# Patient Record
Sex: Male | Born: 1985 | Race: Black or African American | Hispanic: No | Smoking: Current every day smoker
Health system: Southern US, Community
[De-identification: ages and names within clinical notes are randomized; demographics above are authoritative.]

## PROBLEM LIST (undated history)

## (undated) DIAGNOSIS — G35 Multiple sclerosis: Secondary | ICD-10-CM

## (undated) HISTORY — PX: KNEE SURGERY: SHX244

---

## 2011-09-17 ENCOUNTER — Encounter (HOSPITAL_BASED_OUTPATIENT_CLINIC_OR_DEPARTMENT_OTHER): Payer: Self-pay | Admitting: *Deleted

## 2011-09-17 ENCOUNTER — Emergency Department (HOSPITAL_BASED_OUTPATIENT_CLINIC_OR_DEPARTMENT_OTHER)
Admission: EM | Admit: 2011-09-17 | Discharge: 2011-09-17 | Disposition: A | Discharge status: Home / Self Care | Payer: Self-pay | Attending: Emergency Medicine | Admitting: Emergency Medicine

## 2011-09-17 ENCOUNTER — Emergency Department (HOSPITAL_BASED_OUTPATIENT_CLINIC_OR_DEPARTMENT_OTHER): Payer: Self-pay

## 2011-09-17 DIAGNOSIS — R0789 Other chest pain: Secondary | ICD-10-CM | POA: Insufficient documentation

## 2011-09-17 DIAGNOSIS — R0602 Shortness of breath: Secondary | ICD-10-CM | POA: Insufficient documentation

## 2011-09-17 DIAGNOSIS — F419 Anxiety disorder, unspecified: Secondary | ICD-10-CM

## 2011-09-17 DIAGNOSIS — F411 Generalized anxiety disorder: Secondary | ICD-10-CM | POA: Insufficient documentation

## 2011-09-17 MED ORDER — ALBUTEROL SULFATE HFA 108 (90 BASE) MCG/ACT IN AERS
2.0000 | INHALATION_SPRAY | RESPIRATORY_TRACT | Status: DC | PRN
  Filled 2011-09-17: qty 6.7

## 2011-09-17 NOTE — ED Notes (Addendum)
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 Married with 4 children. "Feels like I'm going all the time." Heart feels like it's racing and he feels anxious. Also c/o chest discomfort. "Feels weird"

## 2011-09-17 NOTE — ED Provider Notes (Signed)
History     CSN: [id number]  Arrival date & time 09/17/11  0026   First MD Initiated Contact with Patient 09/17/11 0100      Chief Complaint  Patient presents with  . Anxiety    (Consider location/radiation/quality/duration/timing/severity/associated sxs/prior treatment) HPI Is a 26 year old male with a one-day history of vague sensation in his chest. He initially described it as anxiety as he is working 60 hour week now and feels overwhelmed at home and at work. He states he feels like his heart is pounding and that he is short of breath. He denies frank pain. He denies nausea or vomiting. He denies diaphoresis. He denies a history of asthma or family history of asthma. He denies cough. Symptoms are moderate.  History reviewed. No pertinent past medical history.  Past Surgical History  Procedure Date  . Knee surgery     History reviewed. No pertinent family history.  History  Substance Use Topics  . Smoking status: Current Everyday Smoker  . Smokeless tobacco: Not on file  . Alcohol Use: No      Review of Systems  All other systems reviewed and are negative.    Allergies  Shellfish allergy and Penicillins  Home Medications   Current Outpatient Rx  Name Route Sig Dispense Refill  . IRON-B12-VITAMINS PO Oral Take 1 tablet by mouth.      BP 115/74  Pulse 77  Temp 98.1 F (36.7 C) (Oral)  Resp 20  Ht 6\' 2"  (1.88 m)  Wt 170 lb (77.111 kg)  BMI 21.83 kg/m2  SpO2 100%  Physical Exam General: Well-developed, well-nourished male in no acute distress; appearance consistent with age of record HENT: normocephalic, atraumatic Eyes: pupils equal round and reactive to light; extraocular muscles intact Neck: supple Heart: regular rate and rhythm Lungs: Decreased air movement bilaterally without frank wheezing Abdomen: soft; nondistended; nontender Extremities: No deformity; full range of motion Neurologic: Awake, alert and oriented; motor function intact in all  extremities and symmetric; no facial droop Skin: Warm and dry Psychiatric: Anxious    ED Course  Procedures (including critical care time)     MDM  Nursing notes and vitals signs, including pulse oximetry, reviewed.  Summary of this visit's results, reviewed by myself:  Labs:  No results found for this or any previous visit.  Imaging Studies: Dg Chest 2 View  09/17/2011  *RADIOLOGY REPORT*  Clinical Data: 26 year old male with shortness of breath and chest discomfort.  CHEST - 2 VIEW  Comparison: None.  Findings: Somewhat shallow lung volumes on the PA view, more normal on the lateral. Normal cardiac size and mediastinal contours.  The lungs are clear.  No pneumothorax or effusion. Visualized tracheal air column is within normal limits.  Ventricular size and configuration are within normal limits.  IMPRESSION: Negative, no acute cardiopulmonary abnormality.  Original Report Authenticated By: Ulla Potash III, M.D.    1:44 AM No significant change with albuterol treatment but suspect patient is having some bronchospasm due to decreased air movement. There is no evidence of acute disease on chest x-ray her EKG in patient is young with out significant risk factors.     Date: 09/17/2011 12:46 AM  Rate: 71  Rhythm: normal sinus rhythm  QRS Axis: normal  Intervals: normal  ST/T Wave abnormalities: normal  Conduction Disutrbances: none  Narrative Interpretation: unremarkable  Comparison with previous EKG: none available           Hanley Seamen, MD 09/17/11 0144

## 2011-09-17 NOTE — Discharge Instructions (Signed)

## 2011-09-17 NOTE — Patient Instructions (Signed)
Insructed pt on the proper use of administering albuteral mdi via aerochamber. Pt tolerated well

## 2018-02-10 ENCOUNTER — Inpatient Hospital Stay (HOSPITAL_COMMUNITY)
Admission: EM | Admit: 2018-02-10 | Discharge: 2018-02-14 | DRG: 059 | Disposition: A | Discharge status: Home Health | Payer: Medicaid Other | Attending: Internal Medicine | Admitting: Internal Medicine

## 2018-02-10 ENCOUNTER — Emergency Department (HOSPITAL_COMMUNITY): Payer: Medicaid Other

## 2018-02-10 ENCOUNTER — Other Ambulatory Visit: Payer: Self-pay

## 2018-02-10 ENCOUNTER — Encounter (HOSPITAL_COMMUNITY): Payer: Self-pay | Admitting: Oncology

## 2018-02-10 DIAGNOSIS — Z91013 Allergy to seafood: Secondary | ICD-10-CM

## 2018-02-10 DIAGNOSIS — R29898 Other symptoms and signs involving the musculoskeletal system: Secondary | ICD-10-CM | POA: Diagnosis not present

## 2018-02-10 DIAGNOSIS — E871 Hypo-osmolality and hyponatremia: Secondary | ICD-10-CM | POA: Diagnosis not present

## 2018-02-10 DIAGNOSIS — X58XXXA Exposure to other specified factors, initial encounter: Secondary | ICD-10-CM | POA: Diagnosis present

## 2018-02-10 DIAGNOSIS — Y9289 Other specified places as the place of occurrence of the external cause: Secondary | ICD-10-CM

## 2018-02-10 DIAGNOSIS — Z79899 Other long term (current) drug therapy: Secondary | ICD-10-CM

## 2018-02-10 DIAGNOSIS — T380X5A Adverse effect of glucocorticoids and synthetic analogues, initial encounter: Secondary | ICD-10-CM | POA: Diagnosis not present

## 2018-02-10 DIAGNOSIS — D72829 Elevated white blood cell count, unspecified: Secondary | ICD-10-CM | POA: Diagnosis not present

## 2018-02-10 DIAGNOSIS — F17210 Nicotine dependence, cigarettes, uncomplicated: Secondary | ICD-10-CM | POA: Diagnosis present

## 2018-02-10 DIAGNOSIS — G35 Multiple sclerosis: Principal | ICD-10-CM | POA: Diagnosis present

## 2018-02-10 DIAGNOSIS — R1310 Dysphagia, unspecified: Secondary | ICD-10-CM | POA: Diagnosis present

## 2018-02-10 HISTORY — DX: Multiple sclerosis: G35

## 2018-02-10 LAB — CBC WITH DIFFERENTIAL/PLATELET
Abs Immature Granulocytes: 0.02 10*3/uL (ref 0.00–0.07)
BASOS ABS: 0.1 10*3/uL (ref 0.0–0.1)
BLASTS: 0 %
Band Neutrophils: 0 %
Basophils Relative: 1 %
Eosinophils Absolute: 0.2 10*3/uL (ref 0.0–0.5)
Eosinophils Relative: 2 %
HCT: 41.6 % (ref 39.0–52.0)
HEMOGLOBIN: 13.1 g/dL (ref 13.0–17.0)
Immature Granulocytes: 0 %
LYMPHS ABS: 1.9 10*3/uL (ref 0.7–4.0)
Lymphocytes Relative: 17 %
MCH: 30 pg (ref 26.0–34.0)
MCHC: 31.5 g/dL (ref 30.0–36.0)
MCV: 95.4 fL (ref 80.0–100.0)
METAMYELOCYTES PCT: 0 %
MONOS PCT: 8 %
Monocytes Absolute: 0.9 10*3/uL (ref 0.1–1.0)
Myelocytes: 0 %
NEUTROS ABS: 8 10*3/uL — AB (ref 1.7–7.7)
NEUTROS PCT: 72 %
NRBC: 0 % (ref 0.0–0.2)
NRBC: 0 /100{WBCs}
Other: 0 %
PLATELETS: 172 10*3/uL (ref 150–400)
Promyelocytes Relative: 0 %
RBC: 4.36 MIL/uL (ref 4.22–5.81)
RDW: 13.3 % (ref 11.5–15.5)
WBC: 11 10*3/uL — AB (ref 4.0–10.5)

## 2018-02-10 LAB — BASIC METABOLIC PANEL
Anion gap: 4 — ABNORMAL LOW (ref 5–15)
BUN: 15 mg/dL (ref 6–20)
CO2: 22 mmol/L (ref 22–32)
CREATININE: 0.93 mg/dL (ref 0.61–1.24)
Calcium: 8.6 mg/dL — ABNORMAL LOW (ref 8.9–10.3)
Chloride: 105 mmol/L (ref 98–111)
Glucose, Bld: 100 mg/dL — ABNORMAL HIGH (ref 70–99)
Potassium: 4.2 mmol/L (ref 3.5–5.1)
SODIUM: 131 mmol/L — AB (ref 135–145)

## 2018-02-10 LAB — ETHANOL

## 2018-02-10 MED ORDER — PREGABALIN 75 MG PO CAPS
75.0000 mg | ORAL_CAPSULE | Freq: Two times a day (BID) | ORAL | Status: DC
  Administered 2018-02-10 – 2018-02-14 (×9): 75 mg via ORAL
  Filled 2018-02-10 (×9): qty 1

## 2018-02-10 MED ORDER — GADOBUTROL 1 MMOL/ML IV SOLN
7.0000 mL | Freq: Once | INTRAVENOUS | Status: AC | PRN
  Administered 2018-02-10: 7 mL via INTRAVENOUS

## 2018-02-10 MED ORDER — NICOTINE 14 MG/24HR TD PT24
14.0000 mg | MEDICATED_PATCH | Freq: Every day | TRANSDERMAL | Status: DC
  Administered 2018-02-10 – 2018-02-14 (×5): 14 mg via TRANSDERMAL
  Filled 2018-02-10 (×5): qty 1

## 2018-02-10 MED ORDER — ENOXAPARIN SODIUM 40 MG/0.4ML ~~LOC~~ SOLN
40.0000 mg | SUBCUTANEOUS | Status: DC
  Administered 2018-02-10 – 2018-02-13 (×4): 40 mg via SUBCUTANEOUS
  Filled 2018-02-10 (×4): qty 0.4

## 2018-02-10 MED ORDER — ACETAMINOPHEN 650 MG RE SUPP: 650.0000 mg | Freq: Four times a day (QID) | RECTAL | Status: DC | PRN

## 2018-02-10 MED ORDER — OXYCODONE-ACETAMINOPHEN 5-325 MG PO TABS
1.0000 | ORAL_TABLET | Freq: Once | ORAL | Status: AC
  Administered 2018-02-10: 1 via ORAL
  Filled 2018-02-10: qty 1

## 2018-02-10 MED ORDER — ONDANSETRON HCL 4 MG PO TABS: 4.0000 mg | ORAL_TABLET | Freq: Four times a day (QID) | ORAL | Status: DC | PRN

## 2018-02-10 MED ORDER — ACETAMINOPHEN 325 MG PO TABS
650.0000 mg | ORAL_TABLET | Freq: Four times a day (QID) | ORAL | Status: DC | PRN
  Administered 2018-02-10 – 2018-02-13 (×5): 650 mg via ORAL
  Filled 2018-02-10 (×5): qty 2

## 2018-02-10 MED ORDER — SODIUM CHLORIDE 0.9 % IV SOLN
1000.0000 mg | Freq: Every day | INTRAVENOUS | Status: AC
  Administered 2018-02-10 – 2018-02-14 (×5): 1000 mg via INTRAVENOUS
  Filled 2018-02-10 (×6): qty 8

## 2018-02-10 MED ORDER — ONDANSETRON HCL 4 MG/2ML IJ SOLN: 4.0000 mg | Freq: Four times a day (QID) | INTRAMUSCULAR | Status: DC | PRN

## 2018-02-10 NOTE — ED Notes (Signed)
Patient transported to X-ray 

## 2018-02-10 NOTE — ED Provider Notes (Signed)
[facility] EMERGENCY DEPARTMENT Provider Note   CSN: [id number] Arrival date & time: 02/10/18  0046     History   Chief Complaint Chief Complaint  Patient presents with  . Knee Pain    HPI Bob Solomon is a 32 y.o. male.  The history is provided by the patient and medical records.  Knee Pain       32 y.o. M with hx of MS, presenting to the ED for left knee pain.  States he was walking today after work and felt his left leg "buckle" out from under him causing him to fall on his left knee.  States left knee is now hurting, more so along lateral aspect.  He reports he is having some difficulty walking but does not feel it is due to pain.  He states "I feel like my brain is not getting the signal to my leg".  States he feels uncoordinated.  He denies any focal numbness or weakness of his left leg.  He has not had any low back pain, bowel or bladder incontinence.  He denies any fevers or chills.  No chest pain or shortness of breath.  He does report some intermittent blurred vision which is not necessarily abnormal for him.  States he feels like he may be getting a "cold".  Reports usually when he gets these types of symptoms he develops an MS flare.  States his last MS flare was about 5 months ago, states he was admitted to [facility] at that time and had steroid infusions and MRIs.  He was admitted for a total of about 4 days.  States he is follows with neurologist at [facility], Dr. Gaynelle Adu.  Past Medical History:  Diagnosis Date  . MS (multiple sclerosis) (HCC)     There are no active problems to display for this patient.   Past Surgical History:  Procedure Laterality Date  . KNEE SURGERY          Home Medications    Prior to Admission medications   Medication Sig Start Date End Date Taking? Authorizing Provider  IRON-B12-VITAMINS PO Take 1 tablet by mouth.    [provider]    Family History No family history on file.  Social  History Social History   Tobacco Use  . Smoking status: Current Every Day Smoker    Packs/day: 0.50    Years: 15.00    Pack years: 7.50    Types: Cigarettes  . Smokeless tobacco: Never Used  Substance Use Topics  . Alcohol use: Yes    Comment: Social  . Drug use: No     Allergies   Shellfish allergy and Penicillins   Review of Systems Review of Systems  Musculoskeletal: Positive for arthralgias.  All other systems reviewed and are negative.    Physical Exam Updated Vital Signs BP 128/89   Pulse (!) 109   Temp 98.6 F (37 C) (Oral)   Resp 16   Ht 6\' 2"  (1.88 m)   SpO2 98%   BMI 21.83 kg/m   Physical Exam  Constitutional: He is oriented to person, place, and time. He appears well-developed and well-nourished.  Smells strongly of marijuana  HENT:  Head: Normocephalic and atraumatic.  Mouth/Throat: Oropharynx is clear and moist.  Eyes: Pupils are equal, round, and reactive to light. Conjunctivae and EOM are normal.  EOMs fully intact, no nystagmus  Neck: Normal range of motion.  Cardiovascular: Normal rate, regular rhythm and normal heart sounds.  Pulmonary/Chest: Effort normal and breath sounds normal. No stridor. No respiratory distress.  Abdominal: Soft. Bowel sounds are normal. There is no tenderness. There is no rebound.  Musculoskeletal: Normal range of motion.  Reports pain around lateral aspect of left knee; no swelling or deformity; held in full extension at rest, when instructed to flex knee has apparent difficulty getting leg to move, I am able to passively flex the knee fully with no endorsed pain; leg is NVI No issues moving upper extremities  Neurological: He is alert and oriented to person, place, and time.  AAOx3, answering questions and following commands appropriately; difficulty moving left leg as described in MSK, otherwise normal ROM of all other extremities; no pronator drift, speech clear and goal oriented, no facial droop  Skin: Skin is  warm and dry.  Psychiatric: He has a normal mood and affect.  Nursing note and vitals reviewed.    ED Treatments / Results  Labs (all labs ordered are listed, but only abnormal results are displayed) Labs Reviewed  CBC WITH DIFFERENTIAL/PLATELET - Abnormal; Notable for the following components:      Result Value   WBC 11.0 (*)    Neutro Abs 8.0 (*)    All other components within normal limits  BASIC METABOLIC PANEL - Abnormal; Notable for the following components:   Sodium 131 (*)    Glucose, Bld 100 (*)    Calcium 8.6 (*)    Anion gap 4 (*)    All other components within normal limits  ETHANOL  RAPID URINE DRUG SCREEN, HOSP PERFORMED    EKG None  Radiology Dg Knee Complete 4 Views Left  Result Date: 02/10/2018 CLINICAL DATA:  Pain.  Buckled knee. EXAM: LEFT KNEE - COMPLETE 4+ VIEW COMPARISON:  None. FINDINGS: No evidence of fracture, dislocation, or joint effusion. No evidence of arthropathy or other focal bone abnormality. Soft tissues are unremarkable. IMPRESSION: Negative. Electronically Signed   By: Gerome Sam III M.D   On: 02/10/2018 01:50    Procedures Procedures (including critical care time)  Medications Ordered in ED Medications - No data to display   Initial Impression / Assessment and Plan / ED Course  I have reviewed the triage vital signs and the nursing notes.  Pertinent labs & imaging results that were available during my care of the patient were reviewed by me and considered in my medical decision making (see chart for details).  32 year old male here with left knee pain.  After talking with him, it seems he is having some issues with his left leg.  States he was walking and it "gave out on him".  Does have history of MS and is concerned that he may be having a flare.  States now it feels like his left leg is "uncoordinated" and his brain is "not getting signals to his legs".  Does report some intermittent blurred vision over the past few weeks but  states that is not necessarily uncommon for him.   She denies any headache, dizziness, confusion, focal numbness, or weakness.  No low back pain.  No bowel or bladder incontinence.  Patient reports he was hospitalized at [facility] about 5 months ago for MS flare and received steroid infusions for about 4 days.  I do not see any record of this.  States he had MRIs earlier this year, however I cannot find evidence of this either.  His last MRI in our system is from 2016, last OP neurology follow-up from august 2019 for infusions.  Given his history and new symptoms, will discuss with neurology for recommendations.  1:45 AM  Discussed with Dr. Amada Jupiter-- given isolated leg symptoms, this could localize anywhere.  Recommends MRI w/ and w/out contrast of brain, cervical, and thoracic spine but not lumbar.  If no acute findings, can follow-up with his OP neurologist at [facility].  If new findings, will need to consult neurology again.  Patient has been resting here for the past few hours.  At times, left leg observed to be bent drawn up towards the body and against bed rails so seems to be moving leg better than previously.  Will continue to monitor while awaiting MRI.  6:21 AM Patient in MRI at this time.  If no acute findings, feel he is stable to follow-up OP with his neurologist at [facility].  Care signed out to PA Harris at shift change-- she is aware of plan and will follow-up on MRI's.  Final Clinical Impressions(s) / ED Diagnoses   Final diagnoses:  Left leg weakness    ED Discharge Orders    None       Garlon Hatchet, PA-C 02/10/18 1610    Glynn Octave, MD 02/10/18 [phone]

## 2018-02-10 NOTE — Consult Note (Signed)
NEURO HOSPITALIST CONSULT NOTE   Requestig physician: Dr. Ophelia Charter  Reason for Consult: Left lower extremity weakness  History obtained from:  Patient     HPI:                                                                                                                                          Bob Solomon is an 32 y.o. male presenting with a several week history of dragging his left leg. He is unable to further specify when the symptoms started, but states that he delayed being seen by a physician due to his work. He states that when his LLE weakness worsened yesterday, he decided to go to the ED. He states that his MS was diagnosed several years ago but does not specify further. He states that he is on [employer] and sees a Insurance account manager, Dr. Fransico Michael at [place]. His last exacerbation was 4-5 months ago. Due to difficulty making appointments, the patient states that he may be switched to Tysabri infusions every 6 months. The patient is a somewhat reluctant/poor historian. 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 Has had some difficulty with talking and thinking since about 6 months ago.   MRI brain in the ED showed multiple chronic lesions exhibiting a distribution and morphologies most consistent with MS; 3 faint enhancing lesions were seen.   MRI of C-spine showed a possible demyelinating plaque in the cord at C5, with no enhancement to suggest active demyelination.  MRI of T-spine showed a small cord lesion at T7 consistent with the history of multiple sclerosis, with no enhancement to suggest active demyelination.   Past Medical History:  Diagnosis Date  . MS (multiple sclerosis) (HCC)     Past Surgical History:  Procedure Laterality Date  . KNEE SURGERY      Family History  Family history unknown: Yes              Social History:  reports that he has been smoking cigarettes. He has a 7.50 pack-year smoking history. He has never used smokeless  tobacco. He reports that he drinks alcohol. He reports that he has current or past drug history. Drug: Marijuana.  Allergies  Allergen Reactions  . Shellfish Allergy Anaphylaxis  . Penicillins Rash    MEDICATIONS:  Adderall Pregabalin Tysabri qmonth   ROS:                                                                                                                                       No headache. Has chronic binocular double vision.  No difficulty with chewing or swallowing. No neck pain. No CP, N/V or fever. Occasional cough.   Blood pressure 112/70, pulse 85, temperature 98.6 F (37 C), temperature source Oral, resp. rate 16, height 6\' 2"  (1.88 m), SpO2 97 %.   General Examination:                                                                                                       Physical Exam  HEENT-  [place]/AT   Lungs- Respirations unlabored Abdomen- nondistended Extremities- Warm and well perfused without edema  Neurological Examination Mental Status: Intact to complex questions and commands. No aphasia.  Cranial Nerves: [id number] visual fields all 4 quadrants of each eye. Tracks and fixates normally. PERRL without RAPD.   III,IV, VI: Left ptosis. EOMI.  V,VII: Smile symmetric, facial temp sensation decreased on the right VIII: hearing intact to voice IX,X: Palate rises symmetrically XI: Symmetric shoulder shrug XII: midline tongue extension Motor: Spastic tone in all 4 extremities, mild but worse to BLE relative to upper extremities. LUE 4/5 proximal and distal RUE 4+/5 proximal and distal LLE: 2/5 HF and knee extension; 1/5 ADF and APF RLE: 5/5 Sensory: Decreased temp sensation to LUE. Extinction LUE Decreased temp and FT LLE Deep Tendon Reflexes:  Biceps and brachioradialis: 1+ bilaterally Patellae: 2+ right, 3+ left Achilles: 3+ right, 1+  left Plantars: Right: downgoing   Left: mute Cerebellar: Mild dysmetria and dyssinergia with FNF bilaterally, worse on the left Gait: Deferred   Lab Results: Basic Metabolic Panel: Recent Labs  Lab 02/10/18 0205  NA 131*  K 4.2  CL 105  CO2 22  GLUCOSE 100*  BUN 15  CREATININE 0.93  CALCIUM 8.6*    CBC: Recent Labs  Lab 02/10/18 0205  WBC 11.0*  NEUTROABS 8.0*  HGB 13.1  HCT 41.6  MCV 95.4  PLT 172    Cardiac Enzymes: No results for input(s): CKTOTAL, CKMB, CKMBINDEX, TROPONINI in the last 168 hours.  Lipid Panel: No results for input(s): CHOL, TRIG, HDL, CHOLHDL, VLDL, LDLCALC in the last 168 hours.  Imaging: Mr Laqueta Jean And Wo Contrast  Result Date: 02/10/2018 CLINICAL DATA:  Left leg weakness and fall. History of multiple  sclerosis. EXAM: MRI HEAD WITHOUT AND WITH CONTRAST TECHNIQUE: Multiplanar, multiecho pulse sequences of the brain and surrounding structures were obtained without and with intravenous contrast. CONTRAST:  7 mL Gadavist COMPARISON:  01/18/2015 brain MRI report from [facility] FINDINGS: Brain: There is no evidence of acute infarct, intracranial hemorrhage, mass, midline shift, or extra-axial fluid collection. There is moderate cerebral atrophy. Innumerable T2 hyperintense lesions are present throughout the brain with involvement of the periventricular, deep, and juxtacortical cerebral white matter bilaterally. There is extensive periventricular involvement with multiple lesions oriented perpendicularly to the lateral ventricles. The corpus callosum is involved by multiple lesions and is diffusely thinned. Lesions are present in the cerebellum and right subthalamic region. There are numerous black holes on T1 weighted imaging. Partially ring-enhancing lesions measure 4 mm in the deep right frontal white matter (series 21, image 124) and 4 mm in the periventricular white matter lateral to the atrium of the right lateral ventricle  (series 21, image 111). There is also a 5 mm enhancing juxtacortical lesion in the right parietal lobe (series 22, image 11 and series 23, image 6). Vascular: Major intracranial vascular flow voids are preserved. Skull and upper cervical spine: Unremarkable bone marrow signal. Sinuses/Orbits: Unremarkable orbits. Minimal scattered paranasal sinus mucosal thickening. Clear mastoid air cells. Other: None. IMPRESSION: Extensive changes of multiple sclerosis with 3 subcentimeter enhancing lesions in the right cerebral hemisphere compatible with active demyelination. Electronically Signed   By: Sebastian Ache M.D.   On: 02/10/2018 07:27   Mr Cervical Spine W Wo Contrast  Result Date: 02/10/2018 CLINICAL DATA:  Left leg weakness and fall. History of multiple sclerosis. EXAM: MRI CERVICAL SPINE WITHOUT AND WITH CONTRAST TECHNIQUE: Multiplanar and multiecho pulse sequences of the cervical spine, to include the craniocervical junction and cervicothoracic junction, were obtained without and with intravenous contrast. CONTRAST:  7 mL Gadavist COMPARISON:  None. FINDINGS: Axial sequences are up to moderately motion degraded. Alignment: Cervical spine straightening.  No listhesis. Vertebrae: No fracture, suspicious osseous lesion, or significant marrow edema. Cord: Cord assessment is limited by motion artifact on axial sequences. There is the suggestion of a faintly hyperintense lesion in the left lateral cord at C5 on sagittal T2 and STIR sequences which is not confirmed axially. There is no significant cord expansion or volume loss. There is no abnormal cord enhancement. Posterior Fossa, vertebral arteries, paraspinal tissues: Preserved vertebral artery flow voids. Unremarkable paraspinal soft tissues. Posterior fossa evaluated on separate brain MRI. Disc levels: Preserved disc space heights without disc herniation or stenosis in the cervical spine. Small T1-2 disc protrusion as reported on separate thoracic spine MRI.  IMPRESSION: Motion degraded examination with possible demyelinating plaque in the cord at C5. No enhancement to suggest active demyelination. Electronically Signed   By: Sebastian Ache M.D.   On: 02/10/2018 07:40   Mr Thoracic Spine W Wo Contrast  Result Date: 02/10/2018 CLINICAL DATA:  Left leg weakness and fall. History of multiple sclerosis. EXAM: MRI THORACIC WITHOUT AND WITH CONTRAST TECHNIQUE: Multiplanar and multiecho pulse sequences of the thoracic spine were obtained without and with intravenous contrast. CONTRAST:  7 mL Gadavist COMPARISON:  None. FINDINGS: Alignment:  Normal. Vertebrae: No fracture, suspicious osseous lesion, or significant marrow edema. Cord: Nonenhancing T2 hyperintense lesion in the right cord at T7. No significant cord expansion or volume loss. Paraspinal and other soft tissues: Unremarkable. Disc levels: Small left paracentral disc protrusion at T1-2 without stenosis or spinal cord mass effect. IMPRESSION: 1. Small cord  lesion at T7 consistent with the history of multiple sclerosis. No enhancement to suggest active demyelination. 2. Small T1-2 disc protrusion without stenosis. Electronically Signed   By: Sebastian Ache M.D.   On: 02/10/2018 07:34   Dg Knee Complete 4 Views Left  Result Date: 02/10/2018 CLINICAL DATA:  Pain.  Buckled knee. EXAM: LEFT KNEE - COMPLETE 4+ VIEW COMPARISON:  None. FINDINGS: No evidence of fracture, dislocation, or joint effusion. No evidence of arthropathy or other focal bone abnormality. Soft tissues are unremarkable. IMPRESSION: Negative. Electronically Signed   By: Gerome Sam III M.D   On: 02/10/2018 01:50    Assessment: 32 year old male with MS exacerbation 1. MRI brain reveals 3 faint enhancing lesions in the right cerebral hemisphere on a background of multiple chronic lesions that exhibit a distribution and morphologies that are typical of MS.  2. Exam findings with multiple deficits in a patchy distribution, consistent with the  chronic brain MRI findings.  3. On Armando Reichert as an outpatient. Sees Dr. Fransico Michael at [facility].   4. Mild hyponatremia  Recommendations: 1. IV Solumedrol 1000 mg qd x 5 days 2. Monitor CBG and electrolytes 3. Correct mild hyponatremia 4. PT/OT 5. Outpatient follow up with his Neurologist, Dr. Fransico Michael, after discharge.    Electronically signed: Dr. Caryl Pina 02/10/2018, 11:24 AM

## 2018-02-10 NOTE — H&P (Signed)
History and Physical    Male Bob Solomon ZOX:[id number] DOB: 08/31/1985 DOA: 02/10/2018  PCP:  [facility] Consultants:  [facility] - neurology Patient coming from:  Home - lives with [place]; Jackey LogeSteffanie Rainwater [phone]  Chief Complaint: Knee issue  HPI: Bob Solomon is a 32 y.o. male with medical history significant of MS presenting with fall related to his knee giving out.   He fell at work - his knee buckled out of nowhere.  It happened like that before and was the beginning of an MS exacerbation.  He was hospitalized 5 days that time getting pumped full of steroids.  He was diagnosed with MS 8 years ago, has had flares 11-12 times.  He has been hospitalized for flares 6-7 times.  He has been having diplopia for about 1 month.  He finds himself dragging his left leg a lot.  He is having some trouble swallowing now, thinks he is getting sick with a sore throat.   ED Course:  H/o MS with active lesions.  Not on medication.  Neuro is consulting.  IV solumedrol given.  Review of Systems: As per HPI; otherwise review of systems reviewed and negative.   Ambulatory Status: Ambulates without assistance - supposed to use a cane but he doesn't  Past Medical History:  Diagnosis Date  . MS (multiple sclerosis) (HCC)     Past Surgical History:  Procedure Laterality Date  . KNEE SURGERY      Social History   Socioeconomic History  . Marital status: Married    Spouse name: Not on file  . Number of children: Not on file  . Years of education: Not on file  . Highest education level: Not on file  Occupational History  . Occupation: Financial risk analyst  Social Needs  . Financial resource strain: Not on file  . Food insecurity:    Worry: Not on file    Inability: Not on file  . Transportation needs:    Medical: Not on file    Non-medical: Not on file  Tobacco Use  . Smoking status: Current Every Day Smoker    Packs/day: 0.50    Years: 15.00    Pack years: 7.50    Types: Cigarettes  .  Smokeless tobacco: Never Used  Substance and Sexual Activity  . Alcohol use: Yes    Comment: Social  . Drug use: Yes    Types: Marijuana  . Sexual activity: Yes    Birth control/protection: Condom  Lifestyle  . Physical activity:    Days per week: Not on file    Minutes per session: Not on file  . Stress: Not on file  Relationships  . Social connections:    Talks on phone: Not on file    Gets together: Not on file    Attends religious service: Not on file    Active member of club or organization: Not on file    Attends meetings of clubs or organizations: Not on file    Relationship status: Not on file  . Intimate partner violence:    Fear of current or ex partner: Not on file    Emotionally abused: Not on file    Physically abused: Not on file    Forced sexual activity: Not on file  Other Topics Concern  . Not on file  Social History Narrative  . Not on file    Allergies  Allergen Reactions  . Shellfish Allergy Anaphylaxis  . Penicillins Rash    Family History  Family  history unknown: Yes    Prior to Admission medications   Medication Sig Start Date End Date Taking? Authorizing Provider  ADDERALL XR 30 MG 24 hr capsule Take 30 mg by mouth daily. 01/10/18  Yes [provider]  pregabalin (LYRICA) 75 MG capsule Take 75 mg by mouth 2 (two) times daily. 09/16/15  Yes [provider]    Physical Exam: Vitals:   02/10/18 0200 02/10/18 0215 02/10/18 1251 02/10/18 1252  BP: 120/66 112/70  (!) 131/99  Pulse: 87 85  67  Resp:      Temp:   97.8 F (36.6 C)   TempSrc:   Oral   SpO2: 96% 97%  100%  Height:         General:  Appears calm and comfortable and is NAD Eyes:  PERRL, EOMI, normal lids, iris ENT:  grossly normal hearing, lips & tongue, mmm; appropriate dentition Neck:  no LAD, masses or thyromegaly; no carotid bruits Cardiovascular:  RRR, no m/r/g. No LE edema.  Respiratory:   CTA bilaterally with no wheezes/rales/rhonchi.  Normal  respiratory effort. Abdomen:  soft, NT, ND, NABS Back:   normal alignment, no CVAT Skin:  no rash or induration seen on limited exam Musculoskeletal:  grossly normal tone BUE/BLE other than decreased strength LLE, good ROM, no bony abnormality Lower extremity:  No LE edema.  Limited foot exam with no ulcerations.  2+ distal pulses. Psychiatric:  grossly normal mood and affect, speech fluent and appropriate, AOx3 Neurologic:  CN 2-12 grossly intact, moves all extremities in coordinated fashion, sensation intact   Radiological Exams on Admission: Mr Laqueta Jean And Wo Contrast  Result Date: 02/10/2018 CLINICAL DATA:  Left leg weakness and fall. History of multiple sclerosis. EXAM: MRI HEAD WITHOUT AND WITH CONTRAST TECHNIQUE: Multiplanar, multiecho pulse sequences of the brain and surrounding structures were obtained without and with intravenous contrast. CONTRAST:  7 mL Gadavist COMPARISON:  01/18/2015 brain MRI report from [facility] FINDINGS: Brain: There is no evidence of acute infarct, intracranial hemorrhage, mass, midline shift, or extra-axial fluid collection. There is moderate cerebral atrophy. Innumerable T2 hyperintense lesions are present throughout the brain with involvement of the periventricular, deep, and juxtacortical cerebral white matter bilaterally. There is extensive periventricular involvement with multiple lesions oriented perpendicularly to the lateral ventricles. The corpus callosum is involved by multiple lesions and is diffusely thinned. Lesions are present in the cerebellum and right subthalamic region. There are numerous black holes on T1 weighted imaging. Partially ring-enhancing lesions measure 4 mm in the deep right frontal white matter (series 21, image 124) and 4 mm in the periventricular white matter lateral to the atrium of the right lateral ventricle (series 21, image 111). There is also a 5 mm enhancing juxtacortical lesion in the right parietal  lobe (series 22, image 11 and series 23, image 6). Vascular: Major intracranial vascular flow voids are preserved. Skull and upper cervical spine: Unremarkable bone marrow signal. Sinuses/Orbits: Unremarkable orbits. Minimal scattered paranasal sinus mucosal thickening. Clear mastoid air cells. Other: None. IMPRESSION: Extensive changes of multiple sclerosis with 3 subcentimeter enhancing lesions in the right cerebral hemisphere compatible with active demyelination. Electronically Signed   By: Sebastian Ache M.D.   On: 02/10/2018 07:27   Mr Cervical Spine W Wo Contrast  Result Date: 02/10/2018 CLINICAL DATA:  Left leg weakness and fall. History of multiple sclerosis. EXAM: MRI CERVICAL SPINE WITHOUT AND WITH CONTRAST TECHNIQUE: Multiplanar and multiecho pulse sequences of the cervical spine, to include  the craniocervical junction and cervicothoracic junction, were obtained without and with intravenous contrast. CONTRAST:  7 mL Gadavist COMPARISON:  None. FINDINGS: Axial sequences are up to moderately motion degraded. Alignment: Cervical spine straightening.  No listhesis. Vertebrae: No fracture, suspicious osseous lesion, or significant marrow edema. Cord: Cord assessment is limited by motion artifact on axial sequences. There is the suggestion of a faintly hyperintense lesion in the left lateral cord at C5 on sagittal T2 and STIR sequences which is not confirmed axially. There is no significant cord expansion or volume loss. There is no abnormal cord enhancement. Posterior Fossa, vertebral arteries, paraspinal tissues: Preserved vertebral artery flow voids. Unremarkable paraspinal soft tissues. Posterior fossa evaluated on separate brain MRI. Disc levels: Preserved disc space heights without disc herniation or stenosis in the cervical spine. Small T1-2 disc protrusion as reported on separate thoracic spine MRI. IMPRESSION: Motion degraded examination with possible demyelinating plaque in the cord at C5. No  enhancement to suggest active demyelination. Electronically Signed   By: Sebastian Ache M.D.   On: 02/10/2018 07:40   Mr Thoracic Spine W Wo Contrast  Result Date: 02/10/2018 CLINICAL DATA:  Left leg weakness and fall. History of multiple sclerosis. EXAM: MRI THORACIC WITHOUT AND WITH CONTRAST TECHNIQUE: Multiplanar and multiecho pulse sequences of the thoracic spine were obtained without and with intravenous contrast. CONTRAST:  7 mL Gadavist COMPARISON:  None. FINDINGS: Alignment:  Normal. Vertebrae: No fracture, suspicious osseous lesion, or significant marrow edema. Cord: Nonenhancing T2 hyperintense lesion in the right cord at T7. No significant cord expansion or volume loss. Paraspinal and other soft tissues: Unremarkable. Disc levels: Small left paracentral disc protrusion at T1-2 without stenosis or spinal cord mass effect. IMPRESSION: 1. Small cord lesion at T7 consistent with the history of multiple sclerosis. No enhancement to suggest active demyelination. 2. Small T1-2 disc protrusion without stenosis. Electronically Signed   By: Sebastian Ache M.D.   On: 02/10/2018 07:34   Dg Knee Complete 4 Views Left  Result Date: 02/10/2018 CLINICAL DATA:  Pain.  Buckled knee. EXAM: LEFT KNEE - COMPLETE 4+ VIEW COMPARISON:  None. FINDINGS: No evidence of fracture, dislocation, or joint effusion. No evidence of arthropathy or other focal bone abnormality. Soft tissues are unremarkable. IMPRESSION: Negative. Electronically Signed   By: Gerome Sam III M.D   On: 02/10/2018 01:50    EKG: not done   Labs on Admission: I have personally reviewed the available labs and imaging studies at the time of the admission.  Pertinent labs:   Na++ 131 WBC 11.0 ETOH negative  Assessment/Plan Principal Problem:   Multiple sclerosis exacerbation (HCC)   -Patient with known h/o MS - presenting with multiple vague neurologic complaints amd LLE weakness with recurrent demyelination seen on MRI (brain only, not  spine) -Neurology consulting -Physical/occupational therapy consults.  -Continue IV Solu-Medrol 1000 mg daily x 5 days       DVT prophylaxis: Lovenox  Code Status:  Full - confirmed with patient/family Family Communication: Steffanie Rainwater present throughout evaluation  Disposition Plan:  Home once clinically improved Consults called: Neurology; PT/OT  Admission status: Admit - It is my clinical opinion that admission to INPATIENT is reasonable and necessary because of the expectation that this patient will require hospital care that crosses at least 2 midnights to treat this condition based on the medical complexity of the problems presented.  Given the aforementioned information, the predictability of an adverse outcome is felt to be significant.    Jonah Blue MD [facility]  If note is complete, please contact covering daytime or nighttime physician. [email or web address] Password [email or web address]  02/10/2018, 5:09 PM

## 2018-02-10 NOTE — ED Provider Notes (Signed)
Patient given in sign out at shift change from [facility].  Here with a history of MS.  New onset leg weakness.  No recent flares. He was awaiting MRI which shows a new acute MS flare lesion.  Patient admitted for MS flare.  Seen in consult by cardiology.   Arthor Captain, PA-C 02/11/18 1753    Alvira Monday, MD 02/12/18 [phone]

## 2018-02-10 NOTE — ED Triage Notes (Signed)
Pt reports left knee pain s/p fall.  Pt states that it feels like his MS is flaring up. Rates pain 8/10, aching in nature.

## 2018-02-10 NOTE — ED Notes (Signed)
Patient transported to MRI 

## 2018-02-11 DIAGNOSIS — E871 Hypo-osmolality and hyponatremia: Secondary | ICD-10-CM

## 2018-02-11 LAB — BASIC METABOLIC PANEL
ANION GAP: 9 (ref 5–15)
BUN: 12 mg/dL (ref 6–20)
CHLORIDE: 106 mmol/L (ref 98–111)
CO2: 20 mmol/L — ABNORMAL LOW (ref 22–32)
Calcium: 9 mg/dL (ref 8.9–10.3)
Creatinine, Ser: 1.08 mg/dL (ref 0.61–1.24)
GFR calc Af Amer: >60 mL/min (ref >60)
GLUCOSE: 167 mg/dL — AB (ref 70–99)
POTASSIUM: 4.7 mmol/L (ref 3.5–5.1)
Sodium: 135 mmol/L (ref 135–145)

## 2018-02-11 LAB — CBC
HCT: 47.6 % (ref 39.0–52.0)
HEMOGLOBIN: 15 g/dL (ref 13.0–17.0)
MCH: 29.6 pg (ref 26.0–34.0)
MCHC: 31.5 g/dL (ref 30.0–36.0)
MCV: 93.9 fL (ref 80.0–100.0)
Platelets: 220 10*3/uL (ref 150–400)
RBC: 5.07 MIL/uL (ref 4.22–5.81)
RDW: 12.9 % (ref 11.5–15.5)
WBC: 9.8 10*3/uL (ref 4.0–10.5)
nRBC: 0 % (ref 0.0–0.2)

## 2018-02-11 LAB — HIV ANTIBODY (ROUTINE TESTING W REFLEX): HIV SCREEN 4TH GENERATION: NONREACTIVE

## 2018-02-11 MED ORDER — OXYCODONE HCL 5 MG PO TABS
5.0000 mg | ORAL_TABLET | Freq: Four times a day (QID) | ORAL | Status: DC | PRN
  Administered 2018-02-11 – 2018-02-14 (×8): 5 mg via ORAL
  Filled 2018-02-11 (×8): qty 1

## 2018-02-11 NOTE — Progress Notes (Signed)
[facility] PROGRESS NOTE  Vonn Sliger ZOX:[id number] DOB: [birth year] DOA: 02/10/2018  PCP: System, Pcp Not In  Brief History/Interval Summary: 32 y.o. male with medical history significant of MS presenting with fall related to his knee giving out. He fell at work - his knee buckled out of nowhere.  It happened like that before and was the beginning of an MS exacerbation. He was diagnosed with MS 8 years ago, has had flares 11-12 times.  He has been having diplopia for about 1 month.  He finds himself dragging his left leg a lot.  He is having some trouble swallowing now, thinks he is getting sick with a sore throat.  Patient was seen by neurology and was hospitalized for pulse dose steroids.  Reason for Visit: Exacerbation of MS  Consultants: Neurology  Procedures: None  Antibiotics: None  Subjective/Interval History: Patient states that he feels about the same.  The left leg continues to be weak.  He continues to have double vision.  No new symptoms.  ROS: Denies any nausea or vomiting.  Objective:  Vital Signs  Vitals:   02/10/18 1251 02/10/18 1252 02/10/18 2016 02/11/18 0356  BP:  (!) 131/99 103/71 111/74  Pulse:  67 71 88  Resp:   16 16  Temp: 97.8 F (36.6 C)  98 F (36.7 C) 97.9 F (36.6 C)  TempSrc: Oral  Oral Oral  SpO2:  100% 99% 100%  Height:        Intake/Output Summary (Last 24 hours) at 02/11/2018 1127 Last data filed at 02/10/2018 1505 Gross per 24 hour  Intake 11.27 ml  Output -  Net 11.27 ml   There were no vitals filed for this visit.  General appearance: alert, cooperative, appears stated age and no distress Head: Normocephalic, without obvious abnormality, atraumatic Resp: clear to auscultation bilaterally Cardio: regular rate and rhythm, S1, S2 normal, no murmur, click, rub or gallop GI: soft, non-tender; bowel sounds normal; no masses,  no organomegaly Extremities: extremities normal, atraumatic, no cyanosis or edema Pulses: 2+  and symmetric Neurologic: Patient is awake alert.  Oriented x3.  Weakness noted in the left lower extremity.  Lab Results:  Data Reviewed: I have personally reviewed following labs and imaging studies  CBC: Recent Labs  Lab 02/10/18 0205 02/11/18 0135  WBC 11.0* 9.8  NEUTROABS 8.0*  --   HGB 13.1 15.0  HCT 41.6 47.6  MCV 95.4 93.9  PLT 172 220    Basic Metabolic Panel: Recent Labs  Lab 02/10/18 0205 02/11/18 0135  NA 131* 135  K 4.2 4.7  CL 105 106  CO2 22 20*  GLUCOSE 100* 167*  BUN 15 12  CREATININE 0.93 1.08  CALCIUM 8.6* 9.0      Radiology Studies: Mr Laqueta Jean And Wo Contrast  Result Date: 02/10/2018 CLINICAL DATA:  Left leg weakness and fall. History of multiple sclerosis. EXAM: MRI HEAD WITHOUT AND WITH CONTRAST TECHNIQUE: Multiplanar, multiecho pulse sequences of the brain and surrounding structures were obtained without and with intravenous contrast. CONTRAST:  7 mL Gadavist COMPARISON:  01/18/2015 brain MRI report [facility] FINDINGS: Brain: There is no evidence of acute infarct, intracranial hemorrhage, mass, midline shift, or extra-axial fluid collection. There is moderate cerebral atrophy. Innumerable T2 hyperintense lesions are present throughout the brain with involvement of the periventricular, deep, and juxtacortical cerebral white matter bilaterally. There is extensive periventricular involvement with multiple lesions oriented perpendicularly to the lateral ventricles. The corpus callosum is involved  by multiple lesions and is diffusely thinned. Lesions are present in the cerebellum and right subthalamic region. There are numerous black holes on T1 weighted imaging. Partially ring-enhancing lesions measure 4 mm in the deep right frontal white matter (series 21, image 124) and 4 mm in the periventricular white matter lateral to the atrium of the right lateral ventricle (series 21, image 111). There is also a 5 mm enhancing  juxtacortical lesion in the right parietal lobe (series 22, image 11 and series 23, image 6). Vascular: Major intracranial vascular flow voids are preserved. Skull and upper cervical spine: Unremarkable bone marrow signal. Sinuses/Orbits: Unremarkable orbits. Minimal scattered paranasal sinus mucosal thickening. Clear mastoid air cells. Other: None. IMPRESSION: Extensive changes of multiple sclerosis with 3 subcentimeter enhancing lesions in the right cerebral hemisphere compatible with active demyelination. Electronically Signed   By: Sebastian Ache M.D.   On: 02/10/2018 07:27   Mr Cervical Spine W Wo Contrast  Result Date: 02/10/2018 CLINICAL DATA:  Left leg weakness and fall. History of multiple sclerosis. EXAM: MRI CERVICAL SPINE WITHOUT AND WITH CONTRAST TECHNIQUE: Multiplanar and multiecho pulse sequences of the cervical spine, to include the craniocervical junction and cervicothoracic junction, were obtained without and with intravenous contrast. CONTRAST:  7 mL Gadavist COMPARISON:  None. FINDINGS: Axial sequences are up to moderately motion degraded. Alignment: Cervical spine straightening.  No listhesis. Vertebrae: No fracture, suspicious osseous lesion, or significant marrow edema. Cord: Cord assessment is limited by motion artifact on axial sequences. There is the suggestion of a faintly hyperintense lesion in the left lateral cord at C5 on sagittal T2 and STIR sequences which is not confirmed axially. There is no significant cord expansion or volume loss. There is no abnormal cord enhancement. Posterior Fossa, vertebral arteries, paraspinal tissues: Preserved vertebral artery flow voids. Unremarkable paraspinal soft tissues. Posterior fossa evaluated on separate brain MRI. Disc levels: Preserved disc space heights without disc herniation or stenosis in the cervical spine. Small T1-2 disc protrusion as reported on separate thoracic spine MRI. IMPRESSION: Motion degraded examination with possible  demyelinating plaque in the cord at C5. No enhancement to suggest active demyelination. Electronically Signed   By: Sebastian Ache M.D.   On: 02/10/2018 07:40   Mr Thoracic Spine W Wo Contrast  Result Date: 02/10/2018 CLINICAL DATA:  Left leg weakness and fall. History of multiple sclerosis. EXAM: MRI THORACIC WITHOUT AND WITH CONTRAST TECHNIQUE: Multiplanar and multiecho pulse sequences of the thoracic spine were obtained without and with intravenous contrast. CONTRAST:  7 mL Gadavist COMPARISON:  None. FINDINGS: Alignment:  Normal. Vertebrae: No fracture, suspicious osseous lesion, or significant marrow edema. Cord: Nonenhancing T2 hyperintense lesion in the right cord at T7. No significant cord expansion or volume loss. Paraspinal and other soft tissues: Unremarkable. Disc levels: Small left paracentral disc protrusion at T1-2 without stenosis or spinal cord mass effect. IMPRESSION: 1. Small cord lesion at T7 consistent with the history of multiple sclerosis. No enhancement to suggest active demyelination. 2. Small T1-2 disc protrusion without stenosis. Electronically Signed   By: Sebastian Ache M.D.   On: 02/10/2018 07:34   Dg Knee Complete 4 Views Left  Result Date: 02/10/2018 CLINICAL DATA:  Pain.  Buckled knee. EXAM: LEFT KNEE - COMPLETE 4+ VIEW COMPARISON:  None. FINDINGS: No evidence of fracture, dislocation, or joint effusion. No evidence of arthropathy or other focal bone abnormality. Soft tissues are unremarkable. IMPRESSION: Negative. Electronically Signed   By: Gerome Sam III M.D   On: 02/10/2018 01:50  Medications:  Scheduled: . enoxaparin (LOVENOX) injection  40 mg Subcutaneous Q24H  . nicotine  14 mg Transdermal Daily  . pregabalin  75 mg Oral BID   Continuous: . methylPREDNISolone (SOLU-MEDROL) injection 1,000 mg (02/11/18 0959)   ERX:[id number] **OR** acetaminophen, ondansetron **OR** ondansetron (ZOFRAN) IV, oxyCODONE   Assessment/Plan:  Exacerbation of multiple  sclerosis Patient seen by neurology and started on high-dose Solu-Medrol.  This will be given for 5 days.  First dose was on 11/18.  PT and OT evaluation.  Mild hyponatremia Corrected  DVT Prophylaxis: Lovenox    Code Status: Full code Family Communication: Discussed with the patient Disposition Plan: Management as outlined above.    LOS: 1 day   Osvaldo Shipper  [facility] Pager [phone] 02/11/2018, 11:27 AM  If 7PM-7AM, please contact night-coverage at [email or web address], password [facility]

## 2018-02-11 NOTE — Evaluation (Signed)
Occupational Therapy Evaluation Patient Details Name: Bob Solomon MRN: [id number] DOB: 05/19/1985 Today's Date: 02/11/2018    History of Present Illness Pt is a 32 yo male admitted after a fall at work due to 1 week h/o LLE weakness.  Pt has 8year h/o MS.  Pt with old MS lesions at C5 and T7.  Pt with mutiple chronic lesions consistent with MS and 3 faint enhancing lesions in his brain.  Pt has had diplopia for over a year now.     Clinical Impression   Pt admitted with the above diagnosis and has the deficits listed below. Pt would benefit from cont OT to address home skills and functional mobility with a walker during adl transfers. Pt moves very quickly and can be unsafe at times when on his feet. Pt works at [employer] and lives with fiance but is home alone frequently. Pt does not drive.  Feel he could be educated on fall prevention and equipment to assist him in being more independent.  HHOT and safety eval would be helpful. Pt cannot drive so cannot get to OPOT.  Would like to return to work but will need to get safer and steadier on his feet before this is possible.  Will follow.    Follow Up Recommendations  Home health OT;Supervision/Assistance - 24 hour;Other (comment)(24 hour S for first few days at home)    Equipment Recommendations  3 in 1 bedside commode;Tub/shower bench    Recommendations for Other Services       Precautions / Restrictions Precautions Precautions: Fall Precaution Comments: Pt has fallen 3 times in last 3 months. Restrictions Weight Bearing Restrictions: No Other Position/Activity Restrictions: Would pt benefit from AFO?      Mobility Bed Mobility Overal bed mobility: Modified Independent             General bed mobility comments: extra time  Transfers Overall transfer level: Needs assistance Equipment used: Rolling walker (2 wheeled);1 person hand held assist Transfers: Sit to/from [employer] Sit to Stand:  Min guard Stand pivot transfers: Min assist       General transfer comment: cues for hand placement and to not get walker too far away from his body.  Cues to slow down.    Balance Overall balance assessment: Needs assistance Sitting-balance support: No upper extremity supported;Feet supported Sitting balance-Leahy Scale: Fair     Standing balance support: Bilateral upper extremity supported;During functional activity Standing balance-Leahy Scale: Fair Standing balance comment: Pt could let go of walker for short periods of time without walker but cannot take challenges.                             ADL either performed or assessed with clinical judgement   ADL Overall ADL's : Needs assistance/impaired Eating/Feeding: Independent;Sitting Eating/Feeding Details (indicate cue type and reason): figures out how to open packages and do for himself although not always conventional. Grooming: Wash/dry hands;Wash/dry face;Oral care;Brushing hair;Min guard;Standing Grooming Details (indicate cue type and reason): Pt stood at sink to groom. Pt completes task without assist but balance is not normal and pt requires min guard for safety in standing.  Pt not always safe with his movements. Pt used to moving quickly so is not always safe.  Upper Body Bathing: Set up;Sitting   Lower Body Bathing: Min guard;Sit to/from stand;Cueing for safety   Upper Body Dressing : Set up;Sitting   Lower Body Dressing: Min guard;Sit  to/from stand;Cueing for safety   Toilet Transfer: Minimal assistance;Ambulation;RW;Comfort height toilet;Grab bars;Cueing for safety   Toileting- Clothing Manipulation and Hygiene: Min guard;Sit to/from stand;Cueing for safety       Functional mobility during ADLs: Minimal assistance;Rolling walker General ADL Comments: Pt can complete most basic adls.  He is so accustomed to moving quickly that he is not always safe when on his feet.       Vision Baseline  Vision/History: (double vision from MS) Patient Visual Report: No change from baseline;Diplopia Vision Assessment?: Yes Eye Alignment: Within Functional Limits Alignment/Gaze Preference: Within Defined Limits Tracking/Visual Pursuits: Able to track stimulus in all quads without difficulty Saccades: Within functional limits Visual Fields: No apparent deficits Diplopia Assessment: Disappears with one eye closed Additional Comments: talked to pt about eye patch to rotate and pt is interested     Perception Perception Perception Tested?: Yes   Praxis Praxis Praxis tested?: Within functional limits    Pertinent Vitals/Pain Pain Assessment: Faces Pain Score: 0-No pain     Hand Dominance Left   Extremity/Trunk Assessment Upper Extremity Assessment Upper Extremity Assessment: RUE deficits/detail;LUE deficits/detail RUE Deficits / Details: AROM WFL. Strength:  4+/5 throughout LUE Deficits / Details: AROM WFL. Strength:  4-/5 throughout. Poor RAM and FNF. LUE Sensation: decreased light touch;decreased proprioception LUE Coordination: decreased fine motor   Lower Extremity Assessment Lower Extremity Assessment: Defer to PT evaluation   Cervical / Trunk Assessment Cervical / Trunk Assessment: Normal   Communication Communication Communication: No difficulties   Cognition Arousal/Alertness: Awake/alert Behavior During Therapy: WFL for tasks assessed/performed Overall Cognitive Status: History of cognitive impairments - at baseline                                 General Comments: Pt with some difficulties with memory in last 6 months .   General Comments  Pt motivated and works hard.  Will need a lot of education about MS, safety with mobility and energy conservation.    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  Needs to be mobile.  It has been recommended that he use cane in past but he chooses not to as it slows him down at work.  Pt does not drive.  He has someone that takes him to and from work. Pt lives in [place] but works in [place] off [employer].      Prior Functioning/Environment Level of Independence: Independent        Comments: Pt is independent and chooses not to use cane PTA. Pt does not drive per neurologist's recs.        OT Problem List: Decreased strength;Impaired balance (sitting and/or standing);Impaired vision/perception;Decreased coordination;Decreased cognition;Decreased safety awareness;Decreased knowledge of use of DME or AE;Impaired UE functional use      OT Treatment/Interventions: Self-care/ADL training;Therapeutic activities;Balance training  OT Goals(Current goals can be found in the care plan section) Acute Rehab OT Goals Patient Stated Goal: to get better so I can work OT Goal Formulation: With patient Time For Goal Achievement: 02/25/18 Potential to Achieve Goals: Good ADL Goals Pt Will Perform Grooming: with modified independence;standing Pt Will Perform Tub/Shower Transfer: Tub transfer;with modified independence;rolling walker;ambulating;tub bench Additional ADL Goal #1: Pt will walk to bathroom with walker and complete all toileting tasks with mod I. Additional ADL Goal #2: Pt will gather all clothes with walker and dress with Mod I. Additional ADL Goal #3: Pt will wear eye patch  rotating q 4 hours to manage double vision.  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       Co-evaluation              AM-PAC PT "6 Clicks" Daily Activity     Outcome Measure Help from another person eating meals?: None Help from another person taking care of personal grooming?: None Help from another person toileting, which includes using toliet, bedpan, or urinal?: A Little Help from another person bathing (including washing, rinsing, drying)?: A Little Help from another person to put on and taking off regular upper body clothing?: None Help from another person to put on and taking off regular lower body clothing?: A Little 6 Click Score: 21   End of Session Equipment Utilized During Treatment: Rolling walker Nurse Communication: Mobility status;Other (comment)(need for eye patch)  Activity Tolerance: Patient tolerated treatment well Patient left: in chair;with call bell/phone within reach;with family/visitor present  OT Visit Diagnosis: Unsteadiness on feet (R26.81);Other abnormalities of gait and mobility (R26.89);Other symptoms and signs involving the nervous system (R29.898)                Time: 1610-9604 OT Time Calculation (min): 27 min Charges:  OT General Charges $OT Visit: 1 Visit OT Evaluation $OT Eval Low Complexity: 1 Low OT Treatments $Self Care/Home Management : 8-22 mins  Tory Emerald, OTR/L [phone]  Hope Budds 02/11/2018, 9:55 AM

## 2018-02-11 NOTE — Care Management Note (Addendum)
Case Management Note  Patient Details  Name: Bob Solomon MRN: [id number] Date of Birth: 08-Jan-1986  Subjective/Objective:    32yr old young man admitted after a fall,with MS exacerbation .            Action/Plan: Case manager spoke with patient concerning discharge plan and DME. Referral for Home Health and DME was called to Shon Millet, [facility]. Patient will have support at discharge.    Expected Discharge Date:  pending               Expected Discharge Plan:  Home w Home Health Services  In-House Referral:  NA  Discharge planning Services  CM Consult  Post Acute Care Choice:  Durable Medical Equipment, Home Health Choice offered to:  Patient  DME Arranged:  3-N-1, Walker rolling, Tub bench DME Agency:  [facility]  HH Arranged:  PT HH Agency:  [facility]  Status of Service:  In process, will continue to follow  If discussed at Long Length of Stay Meetings, dates discussed:    Additional Comments: PCP: Dr.Meyers with [facility]    Durenda Guthrie, RN 02/11/2018, 3:46 PM

## 2018-02-11 NOTE — Evaluation (Signed)
Physical Therapy Evaluation Patient Details Name: Bob Solomon MRN: [id number] DOB: [birth year] Today's Date: 02/11/2018   History of Present Illness  Pt is a 32 yo male admitted after a fall at work due to 1 week h/o LLE weakness.  Pt has 8year h/o MS.  Pt with old MS lesions at C5 and T7.  Pt with mutiple chronic lesions consistent with MS and 3 faint enhancing lesions in his brain.  Pt has had diplopia for over a year now.    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 Pt does not drive and has had 3 falls in the past 3 months. Pt presents with LLE>RLE deficits related to exacerbation of MS, listed below. Pt completed mobility with hands on min guard for safety, cuing needed for management of RW. He has been complacent with management of MS, with hope of "not letting it control my life". Discussed importance of controlling MS to prevent it from controlling him and decrease rate of progression of disease. Pt notes psychological effects of living with MS and reports he has attended support group in [place]. Further education and encouragement needed on managing MS for improved safety and daily functioning. PT recommending HHPT at this time to assist with mobility around the home and initiation of HEP. PT will continue to follow acutely.     Follow Up Recommendations Home health PT;Supervision for mobility/OOB    Equipment Recommendations  Rolling walker with 5" wheels       Precautions / Restrictions Precautions Precautions: Fall Precaution Comments: Pt has fallen 3 times in last 3 months. Restrictions Weight Bearing Restrictions: No      Mobility  Bed Mobility Overal bed mobility: Needs Assistance Bed Mobility: Supine to Sit     Supine to sit: Supervision     General bed mobility comments: extra time, uses UE to assist with movement of  LLE to EOB  Transfers Overall transfer level: Needs assistance Equipment used: Rolling walker (2 wheeled) Transfers: Sit to/from Stand Sit to Stand: Min guard         General transfer comment: Cuing for hand placement with RW, hands on min guard for safety, decreased weight shift on LLE in standing   Ambulation/Gait Ambulation/Gait assistance: Min guard Gait Distance (Feet): 240 Feet Assistive device: Rolling walker (2 wheeled) Gait Pattern/deviations: Step-through pattern;Decreased weight shift to left;Ataxic   Gait velocity interpretation: 1.31 - 2.62 ft/sec, indicative of limited community ambulator General Gait Details: Pt ambulating with hands on min guard for safety, vc to stay within RW and slow down when RW gets away from him. Lacking some fluidity with step pattern and progressing RW.      Balance Overall balance assessment: Needs assistance Sitting-balance support: No upper extremity supported;Feet supported Sitting balance-Leahy Scale: Fair     Standing balance support: Bilateral upper extremity supported;During functional activity Standing balance-Leahy Scale: Fair Standing balance comment: Pt able to briefly let go of walker to move objects in his way during gait                             Pertinent Vitals/Pain Pain Assessment: Faces Faces Pain Scale: No hurt    Home Living Family/patient expects to be discharged to:: Private residence Living Arrangements: Spouse/significant other Available Help at Discharge: Family;Available PRN/intermittently Type of Home:  Apartment Home Access: Level entry     Home Layout: Two level;Bed/bath upstairs Home Equipment: Cane - single point Additional Comments: 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  Needs to be mobile, has SPC but reports he does not use as much as he should.  Pt does not drive.  He has someone that takes  him to and from work. Pt lives in [place] but works in [place] off [employer].    Prior Function Level of Independence: Independent         Comments: Pt is independent and chooses not to use cane PTA. Pt does not drive per neurologist's recs.     Hand Dominance   Dominant Hand: Left    Extremity/Trunk Assessment   Upper Extremity Assessment Upper Extremity Assessment: Defer to OT evaluation    Lower Extremity Assessment Lower Extremity Assessment: RLE deficits/detail;LLE deficits/detail RLE Deficits / Details: slight increases in tone present with knee flexion/extension RLE Sensation: decreased light touch LLE Deficits / Details: MMT deficits as follows: Knee extension: 3+/5, Knee flexion: 3+/5, Plantar flexion: 4/5, Dorsiflexion: 4-/5 initially (fatigued quickly); cogwheel rigidity present with knee flexion, hypertonic, delayed activation with AROM LLE Sensation: decreased light touch LLE Coordination: decreased gross motor    Cervical / Trunk Assessment Cervical / Trunk Assessment: Normal  Communication   Communication: No difficulties  Cognition Arousal/Alertness: Awake/alert Behavior During Therapy: WFL for tasks assessed/performed Overall Cognitive Status: History of cognitive impairments - at baseline                                 General Comments: Pt with some difficulties with memory in last 6 months.       General Comments General comments (skin integrity, edema, etc.): Pt's fiance was present for duration of treatment session. Pt educated on importance of regularly attending neurology visits as he reports he missed his last one. Pt also states that he tries to not think about his condition because he doesn't want it to control his life. Education provided on importance of managing MS and symptoms to help slow progression of disease and prevent it from controlling his life. Pt would benefit from AFO to prevent foot drop on LLE,  recommended that he discuss that with this neurologist at his next appointment. Discussed psychological effect of living with MS and he reports that he has attended MS support group in [place]. Also discussed pt's need for regular use of SPC, which he doesn't want to use because of pride/not wanting others to realize he has a medical condition. Discussed alternative option of finding a cane that is more aesthetically pleasing for a young, independent person to use.         Assessment/Plan    PT Assessment Patient needs continued PT services  PT Problem List Decreased strength;Decreased range of motion;Decreased activity tolerance;Decreased balance;Decreased mobility;Decreased coordination;Decreased cognition;Decreased knowledge of use of DME;Decreased safety awareness;Impaired tone;Impaired sensation       PT Treatment Interventions DME instruction;Gait training;Stair training;Functional mobility training;Therapeutic activities;Therapeutic exercise;Balance training;Neuromuscular re-education;Patient/family education    PT Goals (Current goals can be found in the Care Plan section)  Acute Rehab PT Goals Patient Stated Goal: to get better so I can work PT Goal Formulation: With patient Time For Goal Achievement: 02/25/18 Potential to Achieve Goals: Fair  Frequency Min 3X/week    AM-PAC PT "6 Clicks" Daily Activity  Outcome Measure Difficulty turning over in bed (including adjusting bedclothes, sheets and blankets)?: A Lot Difficulty moving from lying on back to sitting on the side of the bed? : A Lot Difficulty sitting down on and standing up from a chair with arms (e.g., wheelchair, bedside commode, etc,.)?: A Little Help needed moving to and from a bed to chair (including a wheelchair)?: A Little Help needed walking in hospital room?: A Little Help needed climbing 3-5 steps with a railing? : A Lot 6 Click Score: 15    End of Session Equipment Utilized During Treatment: Gait  belt Activity Tolerance: Patient tolerated treatment well Patient left: in bed;with call bell/phone within reach;with family/visitor present   PT Visit Diagnosis: Unsteadiness on feet (R26.81);Muscle weakness (generalized) (M62.81);History of falling (Z91.81);Difficulty in walking, not elsewhere classified (R26.2);Other symptoms and signs involving the nervous system (R29.898)    Time: 4098-1191 PT Time Calculation (min) (ACUTE ONLY): 39 min   Charges:   PT Evaluation $PT Eval Moderate Complexity: 1 Mod PT Treatments $Gait Training: 8-22 mins $Therapeutic Activity: 8-22 mins        Rinaldo Cloud, SPT Acute Rehabilitation Services Office [phone]   Rinaldo Cloud 02/11/2018, 2:23 PM

## 2018-02-12 LAB — CBC
HEMATOCRIT: 40.2 % (ref 39.0–52.0)
HEMOGLOBIN: 13.3 g/dL (ref 13.0–17.0)
MCH: 31 pg (ref 26.0–34.0)
MCHC: 33.1 g/dL (ref 30.0–36.0)
MCV: 93.7 fL (ref 80.0–100.0)
NRBC: 0 % (ref 0.0–0.2)
Platelets: 180 10*3/uL (ref 150–400)
RBC: 4.29 MIL/uL (ref 4.22–5.81)
RDW: 13.1 % (ref 11.5–15.5)
WBC: 17.1 10*3/uL — ABNORMAL HIGH (ref 4.0–10.5)

## 2018-02-12 LAB — BASIC METABOLIC PANEL
ANION GAP: 7 (ref 5–15)
BUN: 9 mg/dL (ref 6–20)
CO2: 23 mmol/L (ref 22–32)
Calcium: 8.6 mg/dL — ABNORMAL LOW (ref 8.9–10.3)
Chloride: 107 mmol/L (ref 98–111)
Creatinine, Ser: 0.93 mg/dL (ref 0.61–1.24)
GFR calc Af Amer: >60 mL/min (ref >60)
GLUCOSE: 126 mg/dL — AB (ref 70–99)
POTASSIUM: 4.1 mmol/L (ref 3.5–5.1)
Sodium: 137 mmol/L (ref 135–145)

## 2018-02-12 NOTE — Plan of Care (Signed)

## 2018-02-12 NOTE — Progress Notes (Signed)
PROGRESS NOTE    Bob Solomon  ZOX:[id number] DOB: Sep 04, 1985 DOA: 02/10/2018 PCP: System, Pcp Not In    Brief Narrative: 32 y.o.malewith medical history significant ofMS presenting with fall related to his knee giving out.He fell at work - his knee buckled out of nowhere. It happened like that before and was the beginning of an MS exacerbation. He was diagnosed with MS 8 years ago, has had flares 11-12 times. He has been having diplopia for about 1 month. He finds himself dragging his left leg a lot. He is having some trouble swallowing now, thinks he is getting sick with a sore throat.  Patient was seen by neurology and was hospitalized for pulse dose steroids. Assessment & Plan:   Principal Problem:   Multiple sclerosis exacerbation (HCC)   MS exacerbation: - improving lower extremity weakness.  - IV solumedrol for 5 days, day 3 today.  - neurology on board.    Leukocytosis from steroids.    hyponatremia Resolved.    DVT prophylaxis: lovenox.  Code Status: full code.  Family Communication: none at bedside.  Disposition Plan: pending clinical improvement.    Consultants:   Neurology.    Procedures: None  Antimicrobials: none  Subjective: Improving,   Objective: Vitals:   02/11/18 1451 02/11/18 2100 02/12/18 0359 02/12/18 0825  BP: 108/71 110/72 110/88 120/85  Pulse: 94 84 72 79  Resp:  18 18 18   Temp: 98.3 F (36.8 C) 98.2 F (36.8 C) 98.2 F (36.8 C) 98.6 F (37 C)  TempSrc: Oral Oral Oral Oral  SpO2: 99% 99% 98% 99%  Weight:   77 kg   Height:   6\' 2"  (1.88 m)     Intake/Output Summary (Last 24 hours) at 02/12/2018 0837 Last data filed at 02/11/2018 1804 Gross per 24 hour  Intake 240 ml  Output -  Net 240 ml   Filed Weights   02/12/18 0359  Weight: 77 kg    Examination:  General exam: Appears calm and comfortable  Respiratory system: Clear to auscultation. Respiratory effort normal. Cardiovascular system: S1 & S2 heard, RRR.  No JVD, murmurs, . No pedal edema. Gastrointestinal system: Abdomen is nondistended, soft and nontender.  Normal bowel sounds heard. Central nervous system: Alert and oriented. Improving weakness in the left lower extremity.  Extremities: Symmetric 5 x 5 power. Skin: No rashes, lesions or ulcers Psychiatry:  Mood & affect appropriate.     Data Reviewed: I have personally reviewed following labs and imaging studies  CBC: Recent Labs  Lab 02/10/18 0205 02/11/18 0135 02/12/18 0302  WBC 11.0* 9.8 17.1*  NEUTROABS 8.0*  --   --   HGB 13.1 15.0 13.3  HCT 41.6 47.6 40.2  MCV 95.4 93.9 93.7  PLT 172 220 180   Basic Metabolic Panel: Recent Labs  Lab 02/10/18 0205 02/11/18 0135 02/12/18 0302  NA 131* 135 137  K 4.2 4.7 4.1  CL 105 106 107  CO2 22 20* 23  GLUCOSE 100* 167* 126*  BUN 15 12 9   CREATININE 0.93 1.08 0.93  CALCIUM 8.6* 9.0 8.6*   GFR: Estimated Creatinine Clearance: 123 mL/min (by C-G formula based on SCr of 0.93 mg/dL). Liver Function Tests: No results for input(s): AST, ALT, ALKPHOS, BILITOT, PROT, ALBUMIN in the last 168 hours. No results for input(s): LIPASE, AMYLASE in the last 168 hours. No results for input(s): AMMONIA in the last 168 hours. Coagulation Profile: No results for input(s): INR, PROTIME in the last 168 hours. Cardiac Enzymes: No  results for input(s): CKTOTAL, CKMB, CKMBINDEX, TROPONINI in the last 168 hours. BNP (last 3 results) No results for input(s): PROBNP in the last 8760 hours. HbA1C: No results for input(s): HGBA1C in the last 72 hours. CBG: No results for input(s): GLUCAP in the last 168 hours. Lipid Profile: No results for input(s): CHOL, HDL, LDLCALC, TRIG, CHOLHDL, LDLDIRECT in the last 72 hours. Thyroid Function Tests: No results for input(s): TSH, T4TOTAL, FREET4, T3FREE, THYROIDAB in the last 72 hours. Anemia Panel: No results for input(s): VITAMINB12, FOLATE, FERRITIN, TIBC, IRON, RETICCTPCT in the last 72 hours. Sepsis  Labs: No results for input(s): PROCALCITON, LATICACIDVEN in the last 168 hours.  No results found for this or any previous visit (from the past 240 hour(s)).       Radiology Studies: No results found.      Scheduled Meds: . enoxaparin (LOVENOX) injection  40 mg Subcutaneous Q24H  . nicotine  14 mg Transdermal Daily  . pregabalin  75 mg Oral BID   Continuous Infusions: . methylPREDNISolone (SOLU-MEDROL) injection 1,000 mg (02/11/18 0959)     LOS: 2 days    Time spent: 35 minutes    Kathlen Mody, MD [facility] Pager [phone] If 7PM-7AM, please contact night-coverage [email or web address] Password [facility] 02/12/2018, 8:37 AM

## 2018-02-13 NOTE — Progress Notes (Signed)
Physical Therapy Treatment Patient Details Name: Bob Solomon MRN: [id number] DOB: 1985-08-07 Today's Date: 02/13/2018    History of Present Illness Pt is a 32 yo male admitted after a fall at work due to 1 week h/o LLE weakness.  Pt has 8year h/o MS.  Pt with old MS lesions at C5 and T7.  Pt with mutiple chronic lesions consistent with MS and 3 faint enhancing lesions in his brain.  Pt has had diplopia for over a year now.      PT Comments    Pt presents with improvements in LE symptoms related to MS exacerbation. He is able to complete bed mobility and transfer with supervision for safety, and no compensatory behaviors to assist with movement of LE. Pt ambulating without AD, with hands on min guard progressing to min guard, with some balance deficits still noted but improved foot clearance. Pt educated on HEP, and importance of routine management of his symptoms to slow progression of multiple sclerosis. He is motivated and more aware of the importance of self-care to improve his quality of life, and is more understanding of how to appropriately manage MS. DC plan remains appropriate as he would benefit from HHPT to improve strength, balance and endurance for pt to return to highly active PLOF. PT will continue to follow acutely.    Follow Up Recommendations  Home health PT;Supervision for mobility/OOB     Equipment Recommendations  Rolling walker with 5" wheels       Precautions / Restrictions Precautions Precautions: Fall Precaution Comments: Pt has fallen 3 times in last 3 months. Restrictions Weight Bearing Restrictions: No    Mobility  Bed Mobility Overal bed mobility: Needs Assistance Bed Mobility: Supine to Sit     Supine to sit: Supervision     General bed mobility comments: supervision for safety, no cuing needed  Transfers Overall transfer level: Needs assistance   Transfers: Sit to/from Stand Sit to Stand: Supervision         General transfer comment:  supervision for safety  Ambulation/Gait Ambulation/Gait assistance: Min guard Gait Distance (Feet): 330 Feet Assistive device: None Gait Pattern/deviations: Step-through pattern;Ataxic   Gait velocity interpretation: >4.37 ft/sec, indicative of normal walking speed General Gait Details: hands on min guard for safety, progressing to min guard. Pt ambulating without use of AD, with improved foot clearance on LLE. Some balance deficits still noted with increased medial/lateral sway during gait, but able to recover balance on his own. Vc to slow down as he notices balance deficits.   Stairs Stairs: Yes Stairs assistance: Min guard Stair Management: One rail Left Number of Stairs: 14 General stair comments: hands on min guard for safety, pt ascend/descends stairs with use of one rail, will have rails on both side at home. Pt reports greater difficulty with descending steps, educated on proper sequencing with stair navigation.     Balance Overall balance assessment: Needs assistance Sitting-balance support: No upper extremity supported;Feet supported Sitting balance-Leahy Scale: Good     Standing balance support: Bilateral upper extremity supported;During functional activity Standing balance-Leahy Scale: Good                              Cognition Arousal/Alertness: Awake/alert Behavior During Therapy: WFL for tasks assessed/performed Overall Cognitive Status: History of cognitive impairments - at baseline  General Comments: Pt reports struggling with memory loss and word find over the past few months, says that he writes things down a lot to help manage cognitive deficits.      Exercises General Exercises - Lower Extremity Long Arc Quad: AROM;Strengthening;Both;10 reps;Seated Hip ABduction/ADduction: AROM;Strengthening;Left;5 reps;Supine Straight Leg Raises: AROM;Strengthening;Both;5 reps;Supine Hip Flexion/Marching:  AROM;Strengthening;Both;5 reps;Seated    General Comments General comments (skin integrity, edema, etc.): Edcuation provided on proper form with HEP and importance of managing symptoms related to MS. Encouraged regular attendance of MS support group which pt reports he attends once every 3 months. Discussed importance of regular neurology appointments which pt reports he has scheduled his next routine infusion for next week.       Pertinent Vitals/Pain Pain Assessment: Faces Faces Pain Scale: No hurt           PT Goals (current goals can now be found in the care plan section) Acute Rehab PT Goals Patient Stated Goal: to get better so I can work PT Goal Formulation: With patient Time For Goal Achievement: 02/25/18 Potential to Achieve Goals: Fair Progress towards PT goals: Progressing toward goals    Frequency    Min 3X/week      PT Plan Current plan remains appropriate       AM-PAC PT "6 Clicks" Daily Activity  Outcome Measure  Difficulty turning over in bed (including adjusting bedclothes, sheets and blankets)?: A Lot Difficulty moving from lying on back to sitting on the side of the bed? : A Lot Difficulty sitting down on and standing up from a chair with arms (e.g., wheelchair, bedside commode, etc,.)?: A Little Help needed moving to and from a bed to chair (including a wheelchair)?: A Little Help needed walking in hospital room?: A Little Help needed climbing 3-5 steps with a railing? : A Lot 6 Click Score: 15    End of Session Equipment Utilized During Treatment: Gait belt Activity Tolerance: Patient tolerated treatment well Patient left: in bed;with call bell/phone within reach   PT Visit Diagnosis: Unsteadiness on feet (R26.81);Muscle weakness (generalized) (M62.81);History of falling (Z91.81);Difficulty in walking, not elsewhere classified (R26.2);Other symptoms and signs involving the nervous system (R29.898)     Time: 1610-9604 PT Time Calculation (min)  (ACUTE ONLY): 26 min  Charges:  $Gait Training: 8-22 mins $Therapeutic Exercise: 8-22 mins                     Rinaldo Cloud, SPT Acute Rehabilitation Services Office [phone]    Rinaldo Cloud 02/13/2018, 2:25 PM

## 2018-02-13 NOTE — Progress Notes (Signed)
PROGRESS NOTE    Bob Solomon  WJX:[id number] DOB: 04-May-1985 DOA: 02/10/2018 PCP: System, Pcp Not In    Brief Narrative: 32 y.o.malewith medical history significant ofMS presenting with fall related to his knee giving out.He fell at work - his knee buckled out of nowhere. It happened like that before and was the beginning of an MS exacerbation. He was diagnosed with MS 8 years ago, has had flares 11-12 times. He has been having diplopia for about 1 month. He finds himself dragging his left leg a lot. He is having some trouble swallowing now, thinks he is getting sick with a sore throat.  Patient was seen by neurology and was hospitalized for pulse dose steroids. Assessment & Plan:   Principal Problem:   Multiple sclerosis exacerbation (HCC)   MS exacerbation: - improving lower extremity weakness.  - IV solumedrol for 5 days, day 4 today. Pt reports he feels like he is back to baseline.  - neurology on board.    Leukocytosis from steroids.    hyponatremia Resolved.    DVT prophylaxis: lovenox.  Code Status: full code.  Family Communication: none at bedside.  Disposition Plan: pending clinical improvement.    Consultants:   Neurology.    Procedures: None  Antimicrobials: none  Subjective: Improving, he reports he is back to baseline.  No chest pain or sob. No nausea or vomiting.   Objective: Vitals:   02/12/18 1557 02/12/18 1959 02/13/18 0345 02/13/18 0918  BP: 124/89 123/82 117/81 (!) 131/95  Pulse: 85 79 66 73  Resp: 18 18 18 18   Temp: 98.6 F (37 C) 98 F (36.7 C) 98.1 F (36.7 C) 97.9 F (36.6 C)  TempSrc: Oral Oral Oral Oral  SpO2: 99% 100% 99% 98%  Weight:      Height:        Intake/Output Summary (Last 24 hours) at 02/13/2018 1502 Last data filed at 02/13/2018 1300 Gross per 24 hour  Intake 480 ml  Output -  Net 480 ml   Filed Weights   02/12/18 0359  Weight: 77 kg    Examination:  General exam: Appears calm and comfortable  not in any distress.  Respiratory system: Clear to auscultation. Respiratory effort normal. No wheezing or rhonchi.  Cardiovascular system: S1 & S2 heard, RRR. No JVD, murmurs, . No pedal edema. Gastrointestinal system: Abdomen is soft non distended,  and bs+ Central nervous system: Alert and oriented. Improving weakness in the left lower extremity.  Extremities: Symmetric 5 x 5 power. Skin: No rashes, lesions or ulcers Psychiatry:  Mood & affect appropriate.     Data Reviewed: I have personally reviewed following labs and imaging studies  CBC: Recent Labs  Lab 02/10/18 0205 02/11/18 0135 02/12/18 0302  WBC 11.0* 9.8 17.1*  NEUTROABS 8.0*  --   --   HGB 13.1 15.0 13.3  HCT 41.6 47.6 40.2  MCV 95.4 93.9 93.7  PLT 172 220 180   Basic Metabolic Panel: Recent Labs  Lab 02/10/18 0205 02/11/18 0135 02/12/18 0302  NA 131* 135 137  K 4.2 4.7 4.1  CL 105 106 107  CO2 22 20* 23  GLUCOSE 100* 167* 126*  BUN 15 12 9   CREATININE 0.93 1.08 0.93  CALCIUM 8.6* 9.0 8.6*   GFR: Estimated Creatinine Clearance: 123 mL/min (by C-G formula based on SCr of 0.93 mg/dL). Liver Function Tests: No results for input(s): AST, ALT, ALKPHOS, BILITOT, PROT, ALBUMIN in the last 168 hours. No results for input(s): LIPASE, AMYLASE in  the last 168 hours. No results for input(s): AMMONIA in the last 168 hours. Coagulation Profile: No results for input(s): INR, PROTIME in the last 168 hours. Cardiac Enzymes: No results for input(s): CKTOTAL, CKMB, CKMBINDEX, TROPONINI in the last 168 hours. BNP (last 3 results) No results for input(s): PROBNP in the last 8760 hours. HbA1C: No results for input(s): HGBA1C in the last 72 hours. CBG: No results for input(s): GLUCAP in the last 168 hours. Lipid Profile: No results for input(s): CHOL, HDL, LDLCALC, TRIG, CHOLHDL, LDLDIRECT in the last 72 hours. Thyroid Function Tests: No results for input(s): TSH, T4TOTAL, FREET4, T3FREE, THYROIDAB in the last 72  hours. Anemia Panel: No results for input(s): VITAMINB12, FOLATE, FERRITIN, TIBC, IRON, RETICCTPCT in the last 72 hours. Sepsis Labs: No results for input(s): PROCALCITON, LATICACIDVEN in the last 168 hours.  No results found for this or any previous visit (from the past 240 hour(s)).       Radiology Studies: No results found.      Scheduled Meds: . enoxaparin (LOVENOX) injection  40 mg Subcutaneous Q24H  . nicotine  14 mg Transdermal Daily  . pregabalin  75 mg Oral BID   Continuous Infusions: . methylPREDNISolone (SOLU-MEDROL) injection 1,000 mg (02/13/18 0842)     LOS: 3 days    Time spent: 25 minutes    Kathlen Mody, MD [facility] Pager [phone] If 7PM-7AM, please contact night-coverage [email or web address] Password [email or web address] 02/13/2018, 3:02 PM

## 2018-02-13 NOTE — Progress Notes (Signed)
Occupational Therapy Treatment Patient Details Name: Bob Solomon MRN: [id number] DOB: Nov 26, 1985 Today's Date: 02/13/2018    History of present illness Pt is a 32 yo male admitted after a fall at work due to 1 week h/o LLE weakness.  Pt has 8year h/o MS.  Pt with old MS lesions at C5 and T7.  Pt with mutiple chronic lesions consistent with MS and 3 faint enhancing lesions in his brain.  Pt has had diplopia for over a year now.     OT comments  Pt progressing towards OT goals. Focus of session was to address double vision, Pt reporting double vision with left gaze, horizontal, which is resolved with one eye closed. Non-perscription glasses provided with partial occlusion, which Pt reports resolved his diplopia. Educated on use of glasses and only to wear when necessary. Pt also  Able to transfer, perform sink level grooming at supervision level this session. Encouraged to get involved with support group and regular medical care to decrease flair ups.    Follow Up Recommendations  Home health OT    Equipment Recommendations  Tub/shower seat    Recommendations for Other Services      Precautions / Restrictions Precautions Precautions: Fall Precaution Comments: Pt has fallen 3 times in last 3 months. Restrictions Weight Bearing Restrictions: No       Mobility Bed Mobility Overal bed mobility: Needs Assistance Bed Mobility: Supine to Sit     Supine to sit: Supervision     General bed mobility comments: supervision for safety, no cuing needed  Transfers Overall transfer level: Needs assistance Equipment used: None Transfers: Sit to/from Stand Sit to Stand: Supervision         General transfer comment: supervision for safety    Balance Overall balance assessment: Needs assistance Sitting-balance support: No upper extremity supported;Feet supported Sitting balance-Leahy Scale: Good     Standing balance support: Bilateral upper extremity supported;During functional  activity Standing balance-Leahy Scale: Good                             ADL either performed or assessed with clinical judgement   ADL Overall ADL's : At baseline     Grooming: Supervision/safety;Standing;Wash/dry hands;Wash/dry face Grooming Details (indicate cue type and reason): sink level                 Toilet Transfer: Supervision/safety;Ambulation   Toileting- Clothing Manipulation and Hygiene: Supervision/safety               Vision   Diplopia Assessment: Disappears with one eye closed;Objects split side to side;Only with left gaze Additional Comments: Pt provided glasses and partial occlusion for double vision with left gaze. Pt reports diplopia resolved with glasses   Perception     Praxis      Cognition Arousal/Alertness: Awake/alert Behavior During Therapy: WFL for tasks assessed/performed Overall Cognitive Status: History of cognitive impairments - at baseline                                 General Comments: Pt reports struggling with memory loss and word find over the past few months, says that he writes things down a lot to help manage cognitive deficits.        Exercises Exercises: General Lower Extremity General Exercises - Lower Extremity Long Arc Quad: AROM;Strengthening;Both;10 reps;Seated Hip ABduction/ADduction: AROM;Strengthening;Left;5 reps;Supine Straight Leg Raises: AROM;Strengthening;Both;5 reps;Supine Hip  Flexion/Marching: AROM;Strengthening;Both;5 reps;Seated   Shoulder Instructions       General Comments encouraged participation in support groups. girlfriend/fiance? present throughout session    Pertinent Vitals/ Pain       Pain Assessment: No/denies pain Faces Pain Scale: No hurt  Home Living                                          Prior Functioning/Environment              Frequency  Min 2X/week        Progress Toward Goals  OT Goals(current goals can now be  found in the care plan section)  Progress towards OT goals: Progressing toward goals  Acute Rehab OT Goals Patient Stated Goal: to get better so I can work OT Goal Formulation: With patient Time For Goal Achievement: 02/25/18 Potential to Achieve Goals: Good  Plan Discharge plan remains appropriate;Frequency remains appropriate    Co-evaluation                 AM-PAC PT "6 Clicks" Daily Activity     Outcome Measure   Help from another person eating meals?: None Help from another person taking care of personal grooming?: None Help from another person toileting, which includes using toliet, bedpan, or urinal?: A Little Help from another person bathing (including washing, rinsing, drying)?: A Little Help from another person to put on and taking off regular upper body clothing?: None Help from another person to put on and taking off regular lower body clothing?: A Little 6 Click Score: 21    End of Session Equipment Utilized During Treatment: Other (comment)(glasses to address doubel vision)  OT Visit Diagnosis: Unsteadiness on feet (R26.81);Other abnormalities of gait and mobility (R26.89);Other symptoms and signs involving the nervous system (R29.898)   Activity Tolerance Patient tolerated treatment well   Patient Left in bed;with call bell/phone within reach;with family/visitor present   Nurse Communication Mobility status        Time: 8115-7262 OT Time Calculation (min): 22 min  Charges: OT General Charges $OT Visit: 1 Visit OT Treatments $Self Care/Home Management : 23-37 mins  Sherryl Manges OTR/L Acute Rehabilitation Services Pager: [phone] Office: [phone]   Evern Bio Bonnie Roig 02/13/2018, 4:20 PM

## 2018-02-13 NOTE — Plan of Care (Signed)

## 2018-02-14 DIAGNOSIS — R29898 Other symptoms and signs involving the musculoskeletal system: Secondary | ICD-10-CM

## 2018-02-14 DIAGNOSIS — G35 Multiple sclerosis: Principal | ICD-10-CM

## 2018-02-14 MED ORDER — NICOTINE 14 MG/24HR TD PT24: 14.0000 mg | MEDICATED_PATCH | Freq: Every day | TRANSDERMAL | 0 refills | Status: DC

## 2018-02-14 MED ORDER — PREGABALIN 75 MG PO CAPS: 75.0000 mg | ORAL_CAPSULE | Freq: Two times a day (BID) | ORAL | 0 refills | Status: DC

## 2018-02-14 MED ORDER — LORAZEPAM 0.5 MG PO TABS: 0.5000 mg | ORAL_TABLET | Freq: Two times a day (BID) | ORAL | 0 refills | Status: AC

## 2018-02-14 NOTE — Progress Notes (Signed)
Occupational Therapy Treatment Patient Details Name: Bob Solomon MRN: [id number] DOB: 1985-09-12 Today's Date: 02/14/2018    History of present illness Pt is a 32 yo male admitted after a fall at work due to 1 week h/o LLE weakness.  Pt has 8year h/o MS.  Pt with old MS lesions at C5 and T7.  Pt with mutiple chronic lesions consistent with MS and 3 faint enhancing lesions in his brain.  Pt has had diplopia for over a year now.     OT comments  Pt continues to make progress towards OT goals this session. Pt able to complete dressing tasks as well as transfers and sink level grooming with patient being given a particular sequence to perform as a short term memory task. Pt able to complete everything at supervision to mod I level. OT will continue to follow acutely - still believe that HHOT is apporiate as Pt has chronic illness and they can do education with him as well as work on adaptive strategies in  The home environment when flare ups do occur.    Follow Up Recommendations  Home health OT    Equipment Recommendations  Tub/shower seat    Recommendations for Other Services      Precautions / Restrictions Precautions Precautions: Fall Precaution Comments: Pt has fallen 3 times in last 3 months. Restrictions Weight Bearing Restrictions: No       Mobility Bed Mobility Overal bed mobility: Needs Assistance Bed Mobility: Supine to Sit     Supine to sit: Supervision     General bed mobility comments: Pt OOB and walking around room when OT entered  Transfers Overall transfer level: Needs assistance Equipment used: None Transfers: Sit to/from Stand Sit to Stand: Supervision         General transfer comment: supervision for safety    Balance Overall balance assessment: Needs assistance Sitting-balance support: No upper extremity supported;Feet supported Sitting balance-Leahy Scale: Normal     Standing balance support: Bilateral upper extremity supported;During  functional activity Standing balance-Leahy Scale: Good                             ADL either performed or assessed with clinical judgement   ADL Overall ADL's : At baseline     Grooming: Supervision/safety;Standing;Wash/dry hands;Wash/dry face;Oral care Grooming Details (indicate cue type and reason): sink level, able to manage fine motor and remember sequence of tasks as requested         Upper Body Dressing : Modified independent   Lower Body Dressing: Modified independent   Toilet Transfer: Supervision/safety;Ambulation   Toileting- Clothing Manipulation and Hygiene: Supervision/safety       Functional mobility during ADLs: Supervision/safety       Vision       Perception     Praxis      Cognition Arousal/Alertness: Awake/alert Behavior During Therapy: WFL for tasks assessed/performed Overall Cognitive Status: History of cognitive impairments - at baseline                                 General Comments: Pt reports struggling with memory loss and word find over the past few months, says that he writes things down a lot to help manage cognitive deficits.        Exercises Exercises: Other exercises Other Exercises Other Exercises: Tandem Walking: started with tandem stance, progressed to walking. Pt started with  LOB several times to begin with, decreasing with practice. Appropriate ankle, knee and hip strategies were starting to be used to respond to changes in balance. Other Exercises: Toe Taps: pt able to raise one leg at a time to tap the top of object on floor, working on dynamic balance, with appropriate ankle strategy to maintain balance during single leg stance, no use of UE for support. Other Exercises: Walk and pivots: pt able to pivot quickly to make turns and stop without LOB; practicing anticipatory balance for improved safety with ambulation and turns.    Shoulder Instructions       General Comments Pt's fiance and  children were present for duration of treatment. Educated on balance training, and provided research article with more information about MS and therapy interventions as he enjoys reading and was interested in learning more.     Pertinent Vitals/ Pain       Pain Assessment: No/denies pain Faces Pain Scale: No hurt  Home Living                                          Prior Functioning/Environment              Frequency  Min 2X/week        Progress Toward Goals  OT Goals(current goals can now be found in the care plan section)  Progress towards OT goals: Progressing toward goals  Acute Rehab OT Goals Patient Stated Goal: to get better so I can work OT Goal Formulation: With patient Time For Goal Achievement: 02/25/18 Potential to Achieve Goals: Good  Plan Discharge plan remains appropriate;Frequency remains appropriate    Co-evaluation                 AM-PAC PT "6 Clicks" Daily Activity     Outcome Measure   Help from another person eating meals?: None Help from another person taking care of personal grooming?: None Help from another person toileting, which includes using toliet, bedpan, or urinal?: A Little Help from another person bathing (including washing, rinsing, drying)?: A Little Help from another person to put on and taking off regular upper body clothing?: None Help from another person to put on and taking off regular lower body clothing?: A Little 6 Click Score: 21    End of Session Equipment Utilized During Treatment: Other (comment)(glasses for double visions)  OT Visit Diagnosis: Unsteadiness on feet (R26.81);Other abnormalities of gait and mobility (R26.89);Other symptoms and signs involving the nervous system (R29.898)   Activity Tolerance Patient tolerated treatment well   Patient Left in chair;with call bell/phone within reach;with family/visitor present   Nurse Communication Mobility status        Time:  1610-9604 OT Time Calculation (min): 21 min  Charges: OT General Charges $OT Visit: 1 Visit OT Treatments $Self Care/Home Management : 8-22 mins  Sherryl Manges OTR/L Acute Rehabilitation Services Pager: [phone] Office: [phone]   Evern Bio Yehonatan Grandison 02/14/2018, 1:40 PM

## 2018-02-14 NOTE — Progress Notes (Signed)
Patient discharging home today. Discharge instruction explained to patient and he verbalized understanding. Took all personal belongings. No further questions or concerns voiced.

## 2018-02-14 NOTE — Plan of Care (Signed)

## 2018-02-14 NOTE — Progress Notes (Signed)
Physical Therapy Treatment Patient Details Name: Bob Solomon MRN: [id number] DOB: 1985-05-06 Today's Date: 02/14/2018    History of Present Illness Pt is a 32 yo male admitted after a fall at work due to 1 week h/o LLE weakness.  Pt has 8year h/o MS.  Pt with old MS lesions at C5 and T7.  Pt with mutiple chronic lesions consistent with MS and 3 faint enhancing lesions in his brain.  Pt has had diplopia for over a year now.      PT Comments    Pt presents with improved mobility and balance today, some balance deficits still noted. Initiated balance training exercises to improve static and dynamic balance, as well as reactive and anticipatory balance during gait. Educated on how to incorporate exercises into his daily routine, and provided him with research article to learn more about MS and rehab options to address the symptoms he presents with, as he said he enjoys reading. DC plan remains appropriate at this time, as pt would benefit from additional PT to address strength, balance, and endurance deficits for improved QOL.    Follow Up Recommendations  Home health PT;Supervision for mobility/OOB     Equipment Recommendations  Rolling walker with 5" wheels       Precautions / Restrictions Precautions Precautions: Fall Precaution Comments: Pt has fallen 3 times in last 3 months. Restrictions Weight Bearing Restrictions: No    Mobility  Bed Mobility Overal bed mobility: Needs Assistance Bed Mobility: Supine to Sit     Supine to sit: Supervision     General bed mobility comments: supervision for safety, no cuing needed  Transfers Overall transfer level: Needs assistance Equipment used: None Transfers: Sit to/from Stand Sit to Stand: Supervision         General transfer comment: supervision for safety  Ambulation/Gait Ambulation/Gait assistance: Min guard;Supervision Gait Distance (Feet): 550 Feet Assistive device: None Gait Pattern/deviations: Step-through  pattern;Ataxic   Gait velocity interpretation: >4.37 ft/sec, indicative of normal walking speed General Gait Details: hands on min guard for safety, progressing to min guard. Pt ambulating without use of AD, with improved gait mechanics and some medial/lateral sway still present but less than yesterday. Pt demonstrates good reactive balance with small perturbations during gait training, uses appropriate strategies to regain balance.        Balance Overall balance assessment: Needs assistance Sitting-balance support: No upper extremity supported;Feet supported Sitting balance-Leahy Scale: Normal     Standing balance support: Bilateral upper extremity supported;During functional activity Standing balance-Leahy Scale: Good                              Cognition Arousal/Alertness: Awake/alert Behavior During Therapy: WFL for tasks assessed/performed Overall Cognitive Status: History of cognitive impairments - at baseline                                 General Comments: Pt reports struggling with memory loss and word find over the past few months, says that he writes things down a lot to help manage cognitive deficits.      Exercises Other Exercises Tandem Walking: started with tandem stance, progressed to walking. Pt started with LOB several times to begin with, decreasing with practice. Pt presented with increased use of hip strategies at first, progressing towards more appropriate ankle, knee and hip strategies to respond to changes in balance. Toe Taps: pt able  to raise one leg at a time to tap the top of object on floor, working on dynamic balance, with appropriate ankle strategy to maintain balance during single leg stance, no use of UE for support. Walk and pivots: pt able to pivot quickly to make turns and stop without LOB; practicing anticipatory balance for improved safety with ambulation and turns.  Pt was educated on performing balance activities by a  counter or stable surface for safety.    General Comments General comments (skin integrity, edema, etc.): Pt's fiance and children were present for duration of treatment. Provided research article with more information about MS and therapy interventions as he enjoys reading and was interested in learning more.       Pertinent Vitals/Pain Pain Assessment: No/denies pain Faces Pain Scale: No hurt           PT Goals (current goals can now be found in the care plan section) Acute Rehab PT Goals Patient Stated Goal: to get better so I can work PT Goal Formulation: With patient Time For Goal Achievement: 02/25/18 Potential to Achieve Goals: Fair Progress towards PT goals: Progressing toward goals    Frequency    Min 3X/week      PT Plan Current plan remains appropriate       AM-PAC PT "6 Clicks" Daily Activity  Outcome Measure  Difficulty turning over in bed (including adjusting bedclothes, sheets and blankets)?: None Difficulty moving from lying on back to sitting on the side of the bed? : None Difficulty sitting down on and standing up from a chair with arms (e.g., wheelchair, bedside commode, etc,.)?: None Help needed moving to and from a bed to chair (including a wheelchair)?: None Help needed walking in hospital room?: None Help needed climbing 3-5 steps with a railing? : A Little 6 Click Score: 23    End of Session Equipment Utilized During Treatment: Gait belt Activity Tolerance: Patient tolerated treatment well Patient left: in bed;with call bell/phone within reach;with family/visitor present   PT Visit Diagnosis: Unsteadiness on feet (R26.81);Muscle weakness (generalized) (M62.81);History of falling (Z91.81);Difficulty in walking, not elsewhere classified (R26.2);Other symptoms and signs involving the nervous system (R29.898)     Time: 1610-9604 PT Time Calculation (min) (ACUTE ONLY): 20 min  Charges:  $Therapeutic Exercise: 8-22 mins                      Rinaldo Cloud, SPT Acute Rehabilitation Services Office [phone]    Rinaldo Cloud 02/14/2018, 1:04 PM

## 2018-02-16 NOTE — Discharge Summary (Signed)
Physician Discharge Summary  Bob Solomon:[id number] DOB: [birth year] DOA: 02/10/2018  PCP: System, Pcp Not In  Admit date: 02/10/2018 Discharge date: 02/14/2018  Admitted From: Home Disposition: Home  Recommendations for Outpatient Follow-up:  1. Follow up with PCP in 1-2 weeks 2. Please obtain BMP/CBC in one week 3. Please follow up with neurology the first week of December as scheduled at [facility]: Yes  Discharge Condition: Stable CODE STATUS: Full code Diet recommendation: Heart Healthy  Brief/Interim Summary: 32 y.o.malewith medical history significant ofMS presenting with fall related to his knee giving out.He fell at work - his knee buckled out of nowhere. It happened like that before and was the beginning of an MS exacerbation. He was diagnosed with MS 8 years ago, has had flares 11-12 times. He has been having diplopia for about 1 month. He finds himself dragging his left leg a lot. He is having some trouble swallowing now, thinks he is getting sick with a sore throat.Patient was seen by neurology and was hospitalized for pulse dose steroids.  Discharge Diagnoses:  Principal Problem:   Multiple sclerosis exacerbation (HCC)   MS exacerbation: - improving lower extremity weakness.  Completed 5 days of IV Solu-Medrol pt reports he feels like he is back to baseline.  Recommend outpatient follow-up with neurology at [facility] as scheduled.   Leukocytosis from steroids.    hyponatremia Resolved.    Discharge Instructions  Discharge Instructions    Diet - low sodium heart healthy   Complete by:  As directed    Discharge instructions   Complete by:  As directed    Please follow up with Neurology at [facility] in December first week as scheduled.     Allergies as of 02/14/2018      Reactions   Shellfish Allergy Anaphylaxis   Penicillins Rash      Medication List    TAKE these medications   ADDERALL XR 30 MG 24 hr capsule Generic  drug:  amphetamine-dextroamphetamine Take 30 mg by mouth daily.   LORazepam 0.5 MG tablet Commonly known as:  ATIVAN Take 1 tablet (0.5 mg total) by mouth 2 (two) times daily for 3 days.   nicotine 14 mg/24hr patch Commonly known as:  NICODERM CQ - dosed in mg/24 hours Place 1 patch (14 mg total) onto the skin daily.   pregabalin 75 MG capsule Commonly known as:  LYRICA Take 1 capsule (75 mg total) by mouth 2 (two) times daily.      Follow-up Information    Abou-Zeid, Nuhad. Schedule an appointment as soon as possible for a visit in 1 week(s).   Specialty:  Unknown Physician Specialty         Allergies  Allergen Reactions  . Shellfish Allergy Anaphylaxis  . Penicillins Rash    Consultations:  Neurology   Procedures/Studies: Mr Laqueta Jean And Wo Contrast  Result Date: 02/10/2018 CLINICAL DATA:  Left leg weakness and fall. History of multiple sclerosis. EXAM: MRI HEAD WITHOUT AND WITH CONTRAST TECHNIQUE: Multiplanar, multiecho pulse sequences of the brain and surrounding structures were obtained without and with intravenous contrast. CONTRAST:  7 mL Gadavist COMPARISON:  01/18/2015 brain MRI report from [facility] FINDINGS: Brain: There is no evidence of acute infarct, intracranial hemorrhage, mass, midline shift, or extra-axial fluid collection. There is moderate cerebral atrophy. Innumerable T2 hyperintense lesions are present throughout the brain with involvement of the periventricular, deep, and juxtacortical cerebral white matter bilaterally. There is extensive periventricular involvement with multiple  lesions oriented perpendicularly to the lateral ventricles. The corpus callosum is involved by multiple lesions and is diffusely thinned. Lesions are present in the cerebellum and right subthalamic region. There are numerous black holes on T1 weighted imaging. Partially ring-enhancing lesions measure 4 mm in the deep right frontal white matter (series 21,  image 124) and 4 mm in the periventricular white matter lateral to the atrium of the right lateral ventricle (series 21, image 111). There is also a 5 mm enhancing juxtacortical lesion in the right parietal lobe (series 22, image 11 and series 23, image 6). Vascular: Major intracranial vascular flow voids are preserved. Skull and upper cervical spine: Unremarkable bone marrow signal. Sinuses/Orbits: Unremarkable orbits. Minimal scattered paranasal sinus mucosal thickening. Clear mastoid air cells. Other: None. IMPRESSION: Extensive changes of multiple sclerosis with 3 subcentimeter enhancing lesions in the right cerebral hemisphere compatible with active demyelination. Electronically Signed   By: Sebastian Ache M.D.   On: 02/10/2018 07:27   Mr Cervical Spine W Wo Contrast  Result Date: 02/10/2018 CLINICAL DATA:  Left leg weakness and fall. History of multiple sclerosis. EXAM: MRI CERVICAL SPINE WITHOUT AND WITH CONTRAST TECHNIQUE: Multiplanar and multiecho pulse sequences of the cervical spine, to include the craniocervical junction and cervicothoracic junction, were obtained without and with intravenous contrast. CONTRAST:  7 mL Gadavist COMPARISON:  None. FINDINGS: Axial sequences are up to moderately motion degraded. Alignment: Cervical spine straightening.  No listhesis. Vertebrae: No fracture, suspicious osseous lesion, or significant marrow edema. Cord: Cord assessment is limited by motion artifact on axial sequences. There is the suggestion of a faintly hyperintense lesion in the left lateral cord at C5 on sagittal T2 and STIR sequences which is not confirmed axially. There is no significant cord expansion or volume loss. There is no abnormal cord enhancement. Posterior Fossa, vertebral arteries, paraspinal tissues: Preserved vertebral artery flow voids. Unremarkable paraspinal soft tissues. Posterior fossa evaluated on separate brain MRI. Disc levels: Preserved disc space heights without disc herniation  or stenosis in the cervical spine. Small T1-2 disc protrusion as reported on separate thoracic spine MRI. IMPRESSION: Motion degraded examination with possible demyelinating plaque in the cord at C5. No enhancement to suggest active demyelination. Electronically Signed   By: Sebastian Ache M.D.   On: 02/10/2018 07:40   Mr Thoracic Spine W Wo Contrast  Result Date: 02/10/2018 CLINICAL DATA:  Left leg weakness and fall. History of multiple sclerosis. EXAM: MRI THORACIC WITHOUT AND WITH CONTRAST TECHNIQUE: Multiplanar and multiecho pulse sequences of the thoracic spine were obtained without and with intravenous contrast. CONTRAST:  7 mL Gadavist COMPARISON:  None. FINDINGS: Alignment:  Normal. Vertebrae: No fracture, suspicious osseous lesion, or significant marrow edema. Cord: Nonenhancing T2 hyperintense lesion in the right cord at T7. No significant cord expansion or volume loss. Paraspinal and other soft tissues: Unremarkable. Disc levels: Small left paracentral disc protrusion at T1-2 without stenosis or spinal cord mass effect. IMPRESSION: 1. Small cord lesion at T7 consistent with the history of multiple sclerosis. No enhancement to suggest active demyelination. 2. Small T1-2 disc protrusion without stenosis. Electronically Signed   By: Sebastian Ache M.D.   On: 02/10/2018 07:34   Dg Knee Complete 4 Views Left  Result Date: 02/10/2018 CLINICAL DATA:  Pain.  Buckled knee. EXAM: LEFT KNEE - COMPLETE 4+ VIEW COMPARISON:  None. FINDINGS: No evidence of fracture, dislocation, or joint effusion. No evidence of arthropathy or other focal bone abnormality. Soft tissues are unremarkable. IMPRESSION: Negative. Electronically Signed  By: Gerome Sam III M.D   On: 02/10/2018 01:50       Subjective: No chest pain or shortness of breath, nausea or vomiting  Discharge Exam: Vitals:   02/13/18 2001 02/14/18 0446  BP: 131/87 117/84  Pulse: 74 66  Resp: 20 16  Temp: 98.2 F (36.8 C) 98.2 F (36.8 C)   SpO2: 100% 100%   Vitals:   02/13/18 0918 02/13/18 1724 02/13/18 2001 02/14/18 0446  BP: (!) 131/95 123/83 131/87 117/84  Pulse: 73 74 74 66  Resp: 18 18 20 16   Temp: 97.9 F (36.6 C) 98.5 F (36.9 C) 98.2 F (36.8 C) 98.2 F (36.8 C)  TempSrc: Oral Oral Oral Oral  SpO2: 98% 96% 100% 100%  Weight:      Height:        General: Pt is alert, awake, not in acute distress Cardiovascular: RRR, S1/S2 +, no rubs, no gallops Respiratory: CTA bilaterally, no wheezing, no rhonchi Abdominal: Soft, NT, ND, bowel sounds + Extremities: no edema, no cyanosis    The results of significant diagnostics from this hospitalization (including imaging, microbiology, ancillary and laboratory) are listed below for reference.     Microbiology: No results found for this or any previous visit (from the past 240 hour(s)).   Labs: BNP (last 3 results) No results for input(s): BNP in the last 8760 hours. Basic Metabolic Panel: Recent Labs  Lab 02/10/18 0205 02/11/18 0135 02/12/18 0302  NA 131* 135 137  K 4.2 4.7 4.1  CL 105 106 107  CO2 22 20* 23  GLUCOSE 100* 167* 126*  BUN 15 12 9   CREATININE 0.93 1.08 0.93  CALCIUM 8.6* 9.0 8.6*   Liver Function Tests: No results for input(s): AST, ALT, ALKPHOS, BILITOT, PROT, ALBUMIN in the last 168 hours. No results for input(s): LIPASE, AMYLASE in the last 168 hours. No results for input(s): AMMONIA in the last 168 hours. CBC: Recent Labs  Lab 02/10/18 0205 02/11/18 0135 02/12/18 0302  WBC 11.0* 9.8 17.1*  NEUTROABS 8.0*  --   --   HGB 13.1 15.0 13.3  HCT 41.6 47.6 40.2  MCV 95.4 93.9 93.7  PLT 172 220 180   Cardiac Enzymes: No results for input(s): CKTOTAL, CKMB, CKMBINDEX, TROPONINI in the last 168 hours. BNP: Invalid input(s): POCBNP CBG: No results for input(s): GLUCAP in the last 168 hours. D-Dimer No results for input(s): DDIMER in the last 72 hours. Hgb A1c No results for input(s): HGBA1C in the last 72 hours. Lipid  Profile No results for input(s): CHOL, HDL, LDLCALC, TRIG, CHOLHDL, LDLDIRECT in the last 72 hours. Thyroid function studies No results for input(s): TSH, T4TOTAL, T3FREE, THYROIDAB in the last 72 hours.  Invalid input(s): FREET3 Anemia work up No results for input(s): VITAMINB12, FOLATE, FERRITIN, TIBC, IRON, RETICCTPCT in the last 72 hours. Urinalysis No results found for: COLORURINE, APPEARANCEUR, LABSPEC, PHURINE, GLUCOSEU, HGBUR, BILIRUBINUR, KETONESUR, PROTEINUR, UROBILINOGEN, NITRITE, LEUKOCYTESUR Sepsis Labs Invalid input(s): PROCALCITONIN,  WBC,  LACTICIDVEN Microbiology No results found for this or any previous visit (from the past 240 hour(s)).   Time coordinating discharge: 36 minutes  SIGNED:   Kathlen Mody, MD  [facility] 02/16/2018, 9:26 AM Pager   If 7PM-7AM, please contact night-coverage [email or web address] Password [email or web address]

## 2018-02-17 ENCOUNTER — Other Ambulatory Visit: Payer: Self-pay

## 2018-02-17 ENCOUNTER — Encounter (HOSPITAL_COMMUNITY): Payer: Self-pay

## 2018-02-17 ENCOUNTER — Emergency Department (HOSPITAL_COMMUNITY)
Admission: EM | Admit: 2018-02-17 | Discharge: 2018-02-17 | Disposition: A | Discharge status: Home / Self Care | Payer: Medicaid Other | Attending: Emergency Medicine | Admitting: Emergency Medicine

## 2018-02-17 DIAGNOSIS — R531 Weakness: Secondary | ICD-10-CM | POA: Insufficient documentation

## 2018-02-17 DIAGNOSIS — W010XXA Fall on same level from slipping, tripping and stumbling without subsequent striking against object, initial encounter: Secondary | ICD-10-CM | POA: Diagnosis not present

## 2018-02-17 DIAGNOSIS — W19XXXA Unspecified fall, initial encounter: Secondary | ICD-10-CM

## 2018-02-17 DIAGNOSIS — F17210 Nicotine dependence, cigarettes, uncomplicated: Secondary | ICD-10-CM | POA: Diagnosis not present

## 2018-02-17 DIAGNOSIS — Z79899 Other long term (current) drug therapy: Secondary | ICD-10-CM | POA: Insufficient documentation

## 2018-02-17 DIAGNOSIS — G35 Multiple sclerosis: Secondary | ICD-10-CM | POA: Diagnosis not present

## 2018-02-17 NOTE — ED Triage Notes (Signed)
Pt arrives to ED with complaints of an MS flair-up after falling at work again today when his left knee buckled and gave out under him. Pt alert and oriented, did not hit head during fall. Pt placed in position of comfort with call bell in reach.

## 2018-02-17 NOTE — ED Notes (Signed)
ED Provider at bedside. 

## 2018-02-17 NOTE — ED Provider Notes (Signed)
[facility] EMERGENCY DEPARTMENT Provider Note   CSN: [id number] Arrival date & time: 02/17/18  1757     History   Chief Complaint Chief Complaint  Patient presents with  . Fall    HPI Hawley Ardolino is a 32 y.o. male.  Patient is a 33 y.o. Male with PMHx of MS presenting for fall, patient states that his legs gave out. Patient states that he is unable to his LLE now after his fall. Patient states that he was just recently discharged from the hospital for a MS flare. Patient works as a Financial risk analyst. No numbness or tingling noted. Patient is followed by [facility] and gets monthly injections. He cannot remember when his next appointment is.   The history is provided by the patient and medical records. No language interpreter was used.    Past Medical History:  Diagnosis Date  . MS (multiple sclerosis) [facility])     Patient Active Problem List   Diagnosis Date Noted  . Multiple sclerosis exacerbation (HCC) 02/10/2018    Past Surgical History:  Procedure Laterality Date  . KNEE SURGERY          Home Medications    Prior to Admission medications   Medication Sig Start Date End Date Taking? Authorizing Provider  ADDERALL XR 30 MG 24 hr capsule Take 30 mg by mouth daily. 01/10/18   [provider]  LORazepam (ATIVAN) 0.5 MG tablet Take 1 tablet (0.5 mg total) by mouth 2 (two) times daily for 3 days. 02/14/18 02/17/18  Kathlen Mody, MD  nicotine (NICODERM CQ - DOSED IN MG/24 HOURS) 14 mg/24hr patch Place 1 patch (14 mg total) onto the skin daily. 02/15/18   Kathlen Mody, MD  pregabalin (LYRICA) 75 MG capsule Take 1 capsule (75 mg total) by mouth 2 (two) times daily. 02/14/18   Kathlen Mody, MD    Family History Family History  Family history unknown: Yes    Social History Social History   Tobacco Use  . Smoking status: Current Every Day Smoker    Packs/day: 0.50    Years: 15.00    Pack years: 7.50    Types: Cigarettes  . Smokeless  tobacco: Never Used  . Tobacco comment: "about 3 per day" 02/17/2018  Substance Use Topics  . Alcohol use: Yes    Comment: Social  . Drug use: Yes    Types: Marijuana     Allergies   Shellfish allergy and Penicillins   Review of Systems Review of Systems  Constitutional: Negative for chills and fever.  HENT: Negative for ear pain and sore throat.   Eyes: Negative for pain and visual disturbance.  Respiratory: Negative for cough and shortness of breath.   Cardiovascular: Negative for chest pain and palpitations.  Gastrointestinal: Negative for abdominal pain and vomiting.  Genitourinary: Negative for dysuria and hematuria.  Musculoskeletal: Negative for arthralgias and back pain.  Skin: Negative for color change and rash.  Neurological: Positive for weakness. Negative for dizziness, tremors, seizures, syncope, facial asymmetry, speech difficulty, light-headedness, numbness and headaches.  All other systems reviewed and are negative.    Physical Exam Updated Vital Signs Ht 6\' 2"  (1.88 m)   Wt 77 kg   BMI 21.80 kg/m   Physical Exam  Constitutional: He appears well-developed and well-nourished.  HENT:  Head: Normocephalic and atraumatic.  Eyes: Conjunctivae are normal.  Neck: Neck supple.  Cardiovascular: Normal rate and regular rhythm.  No murmur heard. Pulmonary/Chest: Effort normal and breath sounds normal. No  respiratory distress.  Abdominal: Soft. There is no tenderness.  Musculoskeletal: He exhibits no edema, tenderness or deformity.  Neurological: He is alert. He has normal reflexes. No cranial nerve deficit or sensory deficit. GCS eye subscore is 4. GCS verbal subscore is 5. GCS motor subscore is 6. He displays no Babinski's sign on the right side. He displays no Babinski's sign on the left side.  Reflex Scores:      Bicep reflexes are 2+ on the right side and 2+ on the left side.      Patellar reflexes are 2+ on the right side and 2+ on the left side.       Achilles reflexes are 2+ on the right side and 2+ on the left side. LLE 1/5 strength   Skin: Skin is warm and dry.  Psychiatric: He has a normal mood and affect.  Nursing note and vitals reviewed.    ED Treatments / Results  Labs (all labs ordered are listed, but only abnormal results are displayed) Labs Reviewed - No data to display  EKG None  Radiology No results found.  Procedures Procedures (including critical care time)  Medications Ordered in ED Medications - No data to display   Initial Impression / Assessment and Plan / ED Course  I have reviewed the triage vital signs and the nursing notes.  Pertinent labs & imaging results that were available during my care of the patient were reviewed by me and considered in my medical decision making (see chart for details).     Patient is a 32 y.o. male with PMHX of MS presenting for LLE weakness concerning for MS flare. Patient has recent hospitalization for IV steroids and was discharged. Neurology on call discussed case by phone and stated that there was no indication for emergent imaging or other treatments as patient already got maximum therapy during hospitalization. No signs of trauma after fall, patient denies hitting his head.  Patient counseled to follow up with his outpatient neurologist for further evaluation and management. All questions answered. Patient safe for discharge.  Final Clinical Impressions(s) / ED Diagnoses   Final diagnoses:  Fall, initial encounter    ED Discharge Orders    None       Joaquin Courts, MD 02/17/18 2145    Cathren Laine, MD 02/18/18 1820

## 2018-03-20 ENCOUNTER — Encounter (HOSPITAL_COMMUNITY): Payer: Self-pay | Admitting: Emergency Medicine

## 2018-03-20 ENCOUNTER — Other Ambulatory Visit: Payer: Self-pay | Admitting: Registered Nurse

## 2018-03-20 ENCOUNTER — Other Ambulatory Visit: Payer: Self-pay

## 2018-03-20 ENCOUNTER — Encounter (HOSPITAL_COMMUNITY): Payer: Self-pay | Admitting: *Deleted

## 2018-03-20 ENCOUNTER — Emergency Department (HOSPITAL_COMMUNITY)
Admission: EM | Admit: 2018-03-20 | Discharge: 2018-03-20 | Disposition: A | Discharge status: Other Acute Inpatient Hospital | Payer: Medicaid Other | Attending: Emergency Medicine | Admitting: Emergency Medicine

## 2018-03-20 ENCOUNTER — Inpatient Hospital Stay (HOSPITAL_COMMUNITY)
Admission: AD | Admit: 2018-03-20 | Discharge: 2018-03-25 | DRG: 885 | Disposition: A | Discharge status: Home / Self Care | Payer: Medicaid Other | Attending: Psychiatry | Admitting: Psychiatry

## 2018-03-20 DIAGNOSIS — G4700 Insomnia, unspecified: Secondary | ICD-10-CM | POA: Diagnosis present

## 2018-03-20 DIAGNOSIS — F419 Anxiety disorder, unspecified: Secondary | ICD-10-CM | POA: Diagnosis present

## 2018-03-20 DIAGNOSIS — R45851 Suicidal ideations: Secondary | ICD-10-CM | POA: Diagnosis present

## 2018-03-20 DIAGNOSIS — Z91410 Personal history of adult physical and sexual abuse: Secondary | ICD-10-CM

## 2018-03-20 DIAGNOSIS — Z880 Allergy status to penicillin: Secondary | ICD-10-CM | POA: Diagnosis not present

## 2018-03-20 DIAGNOSIS — Z91013 Allergy to seafood: Secondary | ICD-10-CM | POA: Diagnosis not present

## 2018-03-20 DIAGNOSIS — F32A Depression, unspecified: Secondary | ICD-10-CM

## 2018-03-20 DIAGNOSIS — F329 Major depressive disorder, single episode, unspecified: Secondary | ICD-10-CM

## 2018-03-20 DIAGNOSIS — F322 Major depressive disorder, single episode, severe without psychotic features: Secondary | ICD-10-CM | POA: Insufficient documentation

## 2018-03-20 DIAGNOSIS — Z79899 Other long term (current) drug therapy: Secondary | ICD-10-CM | POA: Insufficient documentation

## 2018-03-20 DIAGNOSIS — F17200 Nicotine dependence, unspecified, uncomplicated: Secondary | ICD-10-CM | POA: Insufficient documentation

## 2018-03-20 DIAGNOSIS — F1290 Cannabis use, unspecified, uncomplicated: Secondary | ICD-10-CM | POA: Diagnosis present

## 2018-03-20 DIAGNOSIS — F332 Major depressive disorder, recurrent severe without psychotic features: Secondary | ICD-10-CM | POA: Diagnosis present

## 2018-03-20 DIAGNOSIS — F909 Attention-deficit hyperactivity disorder, unspecified type: Secondary | ICD-10-CM | POA: Diagnosis present

## 2018-03-20 DIAGNOSIS — F333 Major depressive disorder, recurrent, severe with psychotic symptoms: Secondary | ICD-10-CM | POA: Diagnosis present

## 2018-03-20 DIAGNOSIS — G35 Multiple sclerosis: Secondary | ICD-10-CM | POA: Diagnosis present

## 2018-03-20 DIAGNOSIS — F17210 Nicotine dependence, cigarettes, uncomplicated: Secondary | ICD-10-CM | POA: Diagnosis present

## 2018-03-20 LAB — CBC
HCT: 41.2 % (ref 39.0–52.0)
Hemoglobin: 13.3 g/dL (ref 13.0–17.0)
MCH: 30.9 pg (ref 26.0–34.0)
MCHC: 32.3 g/dL (ref 30.0–36.0)
MCV: 95.8 fL (ref 80.0–100.0)
PLATELETS: 222 10*3/uL (ref 150–400)
RBC: 4.3 MIL/uL (ref 4.22–5.81)
RDW: 13.2 % (ref 11.5–15.5)
WBC: 8.6 10*3/uL (ref 4.0–10.5)
nRBC: 0 % (ref 0.0–0.2)

## 2018-03-20 LAB — COMPREHENSIVE METABOLIC PANEL
ALBUMIN: 3.6 g/dL (ref 3.5–5.0)
ALT: 13 U/L (ref 0–44)
ANION GAP: 7 (ref 5–15)
AST: 17 U/L (ref 15–41)
Alkaline Phosphatase: 57 U/L (ref 38–126)
BUN: 7 mg/dL (ref 6–20)
CO2: 23 mmol/L (ref 22–32)
Calcium: 8.5 mg/dL — ABNORMAL LOW (ref 8.9–10.3)
Chloride: 109 mmol/L (ref 98–111)
Creatinine, Ser: 1 mg/dL (ref 0.61–1.24)
GFR calc non Af Amer: >60 mL/min (ref >60)
Glucose, Bld: 103 mg/dL — ABNORMAL HIGH (ref 70–99)
Potassium: 4.2 mmol/L (ref 3.5–5.1)
SODIUM: 139 mmol/L (ref 135–145)
Total Bilirubin: 0.9 mg/dL (ref 0.3–1.2)
Total Protein: 6.4 g/dL — ABNORMAL LOW (ref 6.5–8.1)

## 2018-03-20 LAB — RAPID URINE DRUG SCREEN, HOSP PERFORMED
AMPHETAMINES: NOT DETECTED
BENZODIAZEPINES: NOT DETECTED
Barbiturates: NOT DETECTED
COCAINE: NOT DETECTED
OPIATES: NOT DETECTED
Tetrahydrocannabinol: POSITIVE — AB

## 2018-03-20 LAB — SALICYLATE LEVEL

## 2018-03-20 LAB — ETHANOL: Alcohol, Ethyl (B): <10 mg/dL (ref <10)

## 2018-03-20 LAB — ACETAMINOPHEN LEVEL

## 2018-03-20 MED ORDER — ACETAMINOPHEN 325 MG PO TABS: 650.0000 mg | ORAL_TABLET | Freq: Four times a day (QID) | ORAL | Status: DC | PRN

## 2018-03-20 MED ORDER — HYDROXYZINE HCL 25 MG PO TABS
25.0000 mg | ORAL_TABLET | Freq: Four times a day (QID) | ORAL | Status: DC | PRN
  Administered 2018-03-21 – 2018-03-23 (×4): 25 mg via ORAL
  Filled 2018-03-20 (×4): qty 1

## 2018-03-20 MED ORDER — NICOTINE 21 MG/24HR TD PT24
21.0000 mg | MEDICATED_PATCH | Freq: Every day | TRANSDERMAL | Status: DC
  Administered 2018-03-20 – 2018-03-25 (×6): 21 mg via TRANSDERMAL
  Filled 2018-03-20 (×10): qty 1

## 2018-03-20 MED ORDER — ENSURE ENLIVE PO LIQD
237.0000 mL | Freq: Two times a day (BID) | ORAL | Status: DC
  Administered 2018-03-21 – 2018-03-24 (×7): 237 mL via ORAL

## 2018-03-20 MED ORDER — TRAZODONE HCL 50 MG PO TABS
50.0000 mg | ORAL_TABLET | Freq: Every evening | ORAL | Status: DC | PRN
  Administered 2018-03-20 – 2018-03-21 (×2): 50 mg via ORAL
  Filled 2018-03-20 (×6): qty 1
  Filled 2018-03-20: qty 2

## 2018-03-20 MED ORDER — ALUM & MAG HYDROXIDE-SIMETH 200-200-20 MG/5ML PO SUSP: 30.0000 mL | ORAL | Status: DC | PRN

## 2018-03-20 MED ORDER — ACETAMINOPHEN 325 MG PO TABS: 650.0000 mg | ORAL_TABLET | ORAL | Status: DC | PRN

## 2018-03-20 MED ORDER — MAGNESIUM HYDROXIDE 400 MG/5ML PO SUSP: 30.0000 mL | Freq: Every day | ORAL | Status: DC | PRN

## 2018-03-20 NOTE — BH Assessment (Signed)
[facility] Assessment Progress Note     Patient has been accepted to [facility] 403-2 at 8:00 pm.  Assunta Found, NP accepting and Malvin Johns, MD attending. Report will need to be called to [phone]

## 2018-03-20 NOTE — ED Notes (Signed)
Pt to room #43, pleasant on approach-sad affect. Pt reports current stressors of recent divorce and health d/t HX of MS. Pt reports increase in hopelessness, and feeling depressed. "I  Just need help."  Identifies children and job as protective factors. encouragement and support provided. Special checks q 15 mins in place for safety, Video monitoring in place. Will continue to monitor.

## 2018-03-20 NOTE — ED Notes (Signed)
TTS at bedside. 

## 2018-03-20 NOTE — ED Provider Notes (Signed)
Patient feeling suicidal for the past 3 weeks.  States he would jump off a bridge.  He is depressed over his multiple sclerosis.  He was recently divorced and he is currently homeless and lost his job.  Patient is alert Glasgow Coma Score 15 ambulates without difficulty   Doug Sou, MD 03/20/18 1534

## 2018-03-20 NOTE — ED Triage Notes (Signed)
Per EMS-states patient has a history of MS-states he doesn't want to live anymore due to his health-states he lives with a friend due to a divorce-wife and kids live together-doesn't have plan, just wants to die

## 2018-03-20 NOTE — ED Notes (Signed)
Pt asleep. Vitals held at this time per RN.

## 2018-03-20 NOTE — ED Notes (Signed)
Informed by [facility], will be ready to receive patient at 8pm, will pass on to oncoming shift.

## 2018-03-20 NOTE — ED Notes (Signed)
EDP at bedside  
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
## 2018-03-20 NOTE — BH Assessment (Signed)
[facility] Assessment Progress Note   Per Nanine Means, DNP, patient is recommended for inpatient treatment.  [facility] is reviewing patient for admission.

## 2018-03-20 NOTE — ED Notes (Signed)
Provider at bedside

## 2018-03-20 NOTE — ED Provider Notes (Signed)
[facility]-EMERGENCY DEPT Provider Note   CSN: [id number] Arrival date & time: 03/20/18  1309     History   Chief Complaint Chief Complaint  Patient presents with  . Suicidal    HPI Bob Solomon is a 32 y.o. male.  Bob Solomon is a 32 y.o. male history of multiple sclerosis, who presents to the emergency department for evaluation of suicidal ideations.  He reports these thoughts have been occurring for the past 3 weeks and becoming more persistent.  Initially denies specific plan, reports he is contemplated jumping off a bridge.  Denies access to weapons.  He denies any attempts to harm himself prior to arrival, no ingestions.  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  He reports because of MS he has been divorced, lost his family, his home and his job.  Patient reports "I feel I have nothing left to live for anymore".  He reports he is followed by neurology at [facility] for his MS, receives immunologic infusions monthly, has not had any issues with this recently.  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  Denies any other fevers or recent illnesses.  No chest pain, shortness of breath or abdominal pain.  Reports he has never been treated for depression before denies any history of previous psychiatric hospitalizations.      Past Medical History:  Diagnosis Date  . MS (multiple sclerosis) [facility])     Patient Active Problem List   Diagnosis Date Noted  . Multiple sclerosis exacerbation (HCC) 02/10/2018    Past Surgical History:  Procedure Laterality Date  . KNEE SURGERY          Home Medications    Prior to Admission medications   Medication Sig Start Date End Date Taking? Authorizing Provider  ADDERALL XR 30 MG 24 hr capsule Take 30 mg by mouth daily. 01/10/18  Yes [provider]    Cyanocobalamin (B-12 PO) Take 1 tablet by mouth daily.   Yes [provider]  nicotine (NICODERM CQ - DOSED IN MG/24 HOURS) 14 mg/24hr patch Place 1 patch (14 mg total) onto the skin daily. 02/15/18  Yes Kathlen Mody, MD  pregabalin (LYRICA) 75 MG capsule Take 1 capsule (75 mg total) by mouth 2 (two) times daily. 02/14/18  Yes Kathlen Mody, MD    Family History Family History  Family history unknown: Yes    Social History Social History   Tobacco Use  . Smoking status: Current Every Day Smoker    Packs/day: 0.50    Years: 15.00    Pack years: 7.50    Types: Cigarettes  . Smokeless tobacco: Never Used  . Tobacco comment: "about 3 per day" 02/17/2018  Substance Use Topics  . Alcohol use: Yes    Comment: Social  . Drug use: Yes    Types: Marijuana     Allergies   Shellfish allergy and Penicillins   Review of Systems Review of Systems  Constitutional: Negative for chills and fever.  HENT: Negative.   Eyes: Negative for visual disturbance.  Respiratory: Negative for cough and shortness of breath.   Cardiovascular: Negative for chest pain.  Gastrointestinal: Negative for abdominal pain, nausea and vomiting.  Genitourinary: Negative for dysuria and frequency.  Musculoskeletal: Negative for arthralgias and myalgias.  Skin: Negative for color change and rash.  Neurological: Negative for dizziness, seizures, syncope, facial asymmetry, speech difficulty, weakness, light-headedness, numbness and headaches.     Physical Exam Updated Vital Signs BP (!) 139/91 (BP Location: Left Arm)   Pulse 85   Temp 98.1 F (36.7 C) (Oral)   Resp 16   SpO2 99%   Physical Exam Vitals signs and nursing note reviewed.  Constitutional:      General: He is not in acute distress.    Appearance: Normal appearance. He is well-developed and normal weight. He is not ill-appearing or diaphoretic.  HENT:     Head: Normocephalic and atraumatic.     Mouth/Throat:     Mouth: Mucous  membranes are moist.     Pharynx: Oropharynx is clear.  Eyes:     General:        Right eye: No discharge.        Left eye: No discharge.     Extraocular Movements: Extraocular movements intact.     Conjunctiva/sclera: Conjunctivae normal.     Pupils: Pupils are equal, round, and reactive to light.  Neck:     Musculoskeletal: Neck supple.  Cardiovascular:     Rate and Rhythm: Normal rate and regular rhythm.     Pulses: Normal pulses.     Heart sounds: Normal heart sounds. No murmur. No friction rub. No gallop.   Pulmonary:     Effort: Pulmonary effort is normal. No respiratory distress.     Breath sounds: Normal breath sounds. No wheezing or rales.     Comments: Respirations equal and unlabored, patient able to speak in full sentences, lungs clear to auscultation bilaterally Abdominal:     General: Abdomen is flat. Bowel sounds are normal. There is no distension.     Palpations: Abdomen is soft. There is no mass.     Tenderness: There is no abdominal tenderness. There is no guarding.     Comments: Abdomen soft, nondistended, nontender to palpation in all quadrants without guarding or peritoneal signs  Musculoskeletal:        General: No deformity.  Skin:    General: Skin is warm and dry.     Capillary Refill: Capillary refill takes less than 2 seconds.  Neurological:     Mental Status: He is alert and oriented to person, place, and time. Mental status is at baseline.     Coordination: Coordination normal.     Comments: Speech is clear, able to follow commands CN III-XII intact Normal strength in upper and lower extremities bilaterally including dorsiflexion and plantar flexion, strong and equal grip strength Sensation normal to light and sharp touch Moves extremities without ataxia, coordination intact   Psychiatric:        Attention and Perception: Attention normal. He does not perceive auditory or visual hallucinations.        Mood and Affect: Mood is depressed.         Speech: Speech normal.        Behavior: Behavior is withdrawn. Behavior is cooperative.        Thought Content: Thought content includes suicidal ideation. Thought content does not include homicidal ideation. Thought content does not include homicidal or suicidal plan.      ED Treatments / Results  Labs (all labs ordered are listed, but only abnormal results are displayed) Labs Reviewed  COMPREHENSIVE METABOLIC PANEL - Abnormal; Notable for the following components:      Result Value   Glucose, Bld 103 (*)    Calcium 8.5 (*)  Total Protein 6.4 (*)    All other components within normal limits  ACETAMINOPHEN LEVEL - Abnormal; Notable for the following components:   Acetaminophen (Tylenol), Serum <10 (*)    All other components within normal limits  ETHANOL  SALICYLATE LEVEL  CBC  RAPID URINE DRUG SCREEN, HOSP PERFORMED    EKG None  Radiology No results found.  Procedures Procedures (including critical care time)  Medications Ordered in ED Medications - No data to display   Initial Impression / Assessment and Plan / ED Course  I have reviewed the triage vital signs and the nursing notes.  Pertinent labs & imaging results that were available during my care of the patient were reviewed by me and considered in my medical decision making (see chart for details).  Patient presents with worsening suicidal ideations.  Current stressors include chronic illness with MS, loss of family support and housing and loss of job.  No prior treatment for depression, no prior psychiatric hospitalizations.  Patient denies focal plan for suicide but reports he just has no desire to live anymore.  Feel patient would benefit from acute psychiatric intervention.  He is not having any new neurologic symptoms from his MS, this is managed by [facility] neurolog[facility] at [facility].  He denies any focal medical complaints today, normal neurologic exam, vitals normal.  Will get medical screening labs.  TTS  consult placed.  Labs overall reassuring, no leukocytosis, normal hemoglobin, no acute electrolyte derangements requiring intervention, normal renal and liver function.  Ethanol, acetaminophen and salicylate levels.  UDS is pending.  At this time is medically cleared for psychiatric evaluation.  Awaiting TTS recommendations for appropriate disposition, feel patient will likely benefit from inpatient psychiatric admission.  Final Clinical Impressions(s) / ED Diagnoses   Final diagnoses:  Suicidal thoughts  Depression, unspecified depression type  MS (multiple sclerosis) [facility])    ED Discharge Orders    None       Dartha Lodge, [place] 03/20/18 1601    Doug Sou, MD 03/20/18 1730

## 2018-03-20 NOTE — ED Notes (Signed)
Pt talking on hallway phone.  

## 2018-03-20 NOTE — ED Notes (Signed)
Specimen cup provided. Pt encouraged to provide urine per MD order  

## 2018-03-21 DIAGNOSIS — F909 Attention-deficit hyperactivity disorder, unspecified type: Secondary | ICD-10-CM

## 2018-03-21 DIAGNOSIS — F322 Major depressive disorder, single episode, severe without psychotic features: Secondary | ICD-10-CM

## 2018-03-21 DIAGNOSIS — F419 Anxiety disorder, unspecified: Secondary | ICD-10-CM

## 2018-03-21 DIAGNOSIS — G4700 Insomnia, unspecified: Secondary | ICD-10-CM

## 2018-03-21 MED ORDER — VORTIOXETINE HBR 10 MG PO TABS
10.0000 mg | ORAL_TABLET | Freq: Every day | ORAL | Status: DC
  Administered 2018-03-21 – 2018-03-25 (×5): 10 mg via ORAL
  Filled 2018-03-21 (×6): qty 1

## 2018-03-21 MED ORDER — PRENATAL MULTIVITAMIN CH
1.0000 | ORAL_TABLET | Freq: Every day | ORAL | Status: DC
  Administered 2018-03-21 – 2018-03-24 (×4): 1 via ORAL
  Filled 2018-03-21 (×5): qty 1

## 2018-03-21 MED ORDER — AMPHETAMINE-DEXTROAMPHET ER 10 MG PO CP24
20.0000 mg | ORAL_CAPSULE | Freq: Every day | ORAL | Status: DC
  Administered 2018-03-21 – 2018-03-25 (×5): 20 mg via ORAL
  Filled 2018-03-21 (×5): qty 2

## 2018-03-21 MED ORDER — PREGABALIN 50 MG PO CAPS
50.0000 mg | ORAL_CAPSULE | Freq: Three times a day (TID) | ORAL | Status: DC
  Administered 2018-03-21 – 2018-03-25 (×12): 50 mg via ORAL
  Filled 2018-03-21 (×12): qty 1

## 2018-03-21 MED ORDER — TEMAZEPAM 15 MG PO CAPS
30.0000 mg | ORAL_CAPSULE | Freq: Every day | ORAL | Status: DC
  Administered 2018-03-21 – 2018-03-24 (×4): 30 mg via ORAL
  Filled 2018-03-21 (×4): qty 2

## 2018-03-21 NOTE — [facility] Suicide Risk Assessment (Signed)
Suicide Prevention Education:  Education Completed; Aneil Angelini, ex-wife, [phone] has been identified by the patient as the family member/significant other with whom the patient will be residing, and identified as the person(s) who will aid the patient in the event of a mental health crisis (suicidal ideations/suicide attempt).  With written consent from the patient, the family member/significant other has been provided the following suicide prevention education, prior to the and/or following the discharge of the patient.  The suicide prevention education provided includes the following:  Suicide risk factors  Suicide prevention and interventions  National Suicide Hotline telephone number  [facility] assessment telephone number  [facility] Emergency Assistance 911  [facility] and/or Residential Mobile Crisis Unit telephone number  Request made of family/significant other to:  Remove weapons (e.g., guns, rifles, knives), all items previously/currently identified as safety concern.    Remove drugs/medications (over-the-counter, prescriptions, illicit drugs), all items previously/currently identified as a safety concern.  The family member/significant other verbalizes understanding of the suicide prevention education information provided.  The family member/significant other agrees to remove the items of safety concern listed above.  CSW spoke with the Patient's ex-wife who reported concerns that "this more than depression, I think some of this is back to issues that he had as a child".  Patient's ex-wife reports that the "he doesn't have an address and was staying in his car until he lost that, now he stays with friends when he can.  Everything that he wants he can't seem to obtain".  She reports that the patient does not have access to weapons.  She also requested that patient be told that "the kids love him and we are all rooting for him".  Harden Mo MSW, LCSW 03/21/2018, 3:07 PM

## 2018-03-21 NOTE — Tx Team (Signed)
Interdisciplinary Treatment and Diagnostic Plan Update  03/21/2018 Time of Session: [id number] Bob Solomon MRN: [id number]  Principal Diagnosis: MDD, recurrent, severe  Secondary Diagnoses: Active Problems:   MDD (major depressive disorder)   MDD (major depressive disorder), recurrent episode, severe (HCC)   Current Medications:  Current Facility-Administered Medications  Medication Dose Route Frequency Provider Last Rate Last Dose  . acetaminophen (TYLENOL) tablet 650 mg  650 mg Oral Q6H PRN Kerry HoughSimon, Spencer E, PA-C      . alum & mag hydroxide-simeth (MAALOX/MYLANTA) 200-200-20 MG/5ML suspension 30 mL  30 mL Oral Q4H PRN Donell SievertSimon, Spencer E, PA-C      . amphetamine-dextroamphetamine (ADDERALL XR) 24 hr capsule 20 mg  20 mg Oral Daily Malvin JohnsFarah, Brian, MD   20 mg at 03/21/18 1029  . feeding supplement (ENSURE ENLIVE) (ENSURE ENLIVE) liquid 237 mL  237 mL Oral BID BM Cobos, Rockey SituFernando A, MD   237 mL at 03/21/18 1030  . hydrOXYzine (ATARAX/VISTARIL) tablet 25 mg  25 mg Oral Q6H PRN Donell SievertSimon, Spencer E, PA-C      . magnesium hydroxide (MILK OF MAGNESIA) suspension 30 mL  30 mL Oral Daily PRN Donell SievertSimon, Spencer E, PA-C      . nicotine (NICODERM CQ - dosed in mg/24 hours) patch 21 mg  21 mg Transdermal Daily Cobos, Rockey SituFernando A, MD   21 mg at 03/21/18 1027  . pregabalin (LYRICA) capsule 50 mg  50 mg Oral TID Malvin JohnsFarah, Brian, MD   50 mg at 03/21/18 1029  . prenatal multivitamin tablet 1 tablet  1 tablet Oral Q1200 Malvin JohnsFarah, Brian, MD   1 tablet at 03/21/18 1029  . temazepam (RESTORIL) capsule 30 mg  30 mg Oral QHS Malvin JohnsFarah, Brian, MD      . traZODone (DESYREL) tablet 50 mg  50 mg Oral QHS,MR X 1 Kerry HoughSimon, Spencer E, PA-C   50 mg at 03/20/18 2203  . vortioxetine HBr (TRINTELLIX) tablet 10 mg  10 mg Oral Daily Malvin JohnsFarah, Brian, MD   10 mg at 03/21/18 1030   PTA Medications: Medications Prior to Admission  Medication Sig Dispense Refill Last Dose  . ADDERALL XR 30 MG 24 hr capsule Take 30 mg by mouth daily.  0 Past Month at  Unknown time  . Cyanocobalamin (B-12 PO) Take 1 tablet by mouth daily.   Past Week at Unknown time  . nicotine (NICODERM CQ - DOSED IN MG/24 HOURS) 14 mg/24hr patch Place 1 patch (14 mg total) onto the skin daily. 28 patch 0 Past Week at Unknown time  . pregabalin (LYRICA) 75 MG capsule Take 1 capsule (75 mg total) by mouth 2 (two) times daily. 60 capsule 0 Past Month at Unknown time    Patient Stressors: Financial difficulties Health problems Loss of marriage - grieving divorce, unable to see children often Medication change or noncompliance  Patient Strengths: Ability for insight Active sense of humor Communication skills General fund of knowledge Motivation for treatment/growth Work skills  Treatment Modalities: Medication Management, Group therapy, Case management,  1 to 1 session with clinician, Psychoeducation, Recreational therapy.   Physician Treatment Plan for Primary Diagnosis:  MDD, recurrent, severe Long Term Goal(s): Improvement in symptoms so as ready for discharge Improvement in symptoms so as ready for discharge   Short Term Goals: Ability to identify changes in lifestyle to reduce recurrence of condition will improve Ability to disclose and discuss suicidal ideas Ability to demonstrate self-control will improve Ability to identify and develop effective coping behaviors will improve Ability to identify and  develop effective coping behaviors will improve Ability to maintain clinical measurements within normal limits will improve Compliance with prescribed medications will improve  Medication Management: Evaluate patient's response, side effects, and tolerance of medication regimen.  Therapeutic Interventions: 1 to 1 sessions, Unit Group sessions and Medication administration.  Evaluation of Outcomes: Progressing  Physician Treatment Plan for Secondary Diagnosis: Active Problems:   MDD (major depressive disorder)   MDD (major depressive disorder), recurrent  episode, severe (HCC)  Long Term Goal(s): Improvement in symptoms so as ready for discharge Improvement in symptoms so as ready for discharge   Short Term Goals: Ability to identify changes in lifestyle to reduce recurrence of condition will improve Ability to disclose and discuss suicidal ideas Ability to demonstrate self-control will improve Ability to identify and develop effective coping behaviors will improve Ability to identify and develop effective coping behaviors will improve Ability to maintain clinical measurements within normal limits will improve Compliance with prescribed medications will improve     Medication Management: Evaluate patient's response, side effects, and tolerance of medication regimen.  Therapeutic Interventions: 1 to 1 sessions, Unit Group sessions and Medication administration.  Evaluation of Outcomes: Progressing   RN Treatment Plan for Primary Diagnosis:  MDD, recurrent, severe Long Term Goal(s): Knowledge of disease and therapeutic regimen to maintain health will improve  Short Term Goals: Ability to remain free from injury will improve, Ability to disclose and discuss suicidal ideas and Ability to identify and develop effective coping behaviors will improve  Medication Management: RN will administer medications as ordered by provider, will assess and evaluate patient's response and provide education to patient for prescribed medication. RN will report any adverse and/or side effects to prescribing provider.  Therapeutic Interventions: 1 on 1 counseling sessions, Psychoeducation, Medication administration, Evaluate responses to treatment, Monitor vital signs and CBGs as ordered, Perform/monitor CIWA, COWS, AIMS and Fall Risk screenings as ordered, Perform wound care treatments as ordered.  Evaluation of Outcomes: Progressing   LCSW Treatment Plan for Primary Diagnosis:  MDD, recurrent, severe Long Term Goal(s): Safe transition to appropriate next  level of care at discharge, Engage patient in therapeutic group addressing interpersonal concerns.  Short Term Goals: Engage patient in aftercare planning with referrals and resources, Facilitate patient progression through stages of change regarding substance use diagnoses and concerns and Identify triggers associated with mental health/substance abuse issues  Therapeutic Interventions: Assess for all discharge needs, 1 to 1 time with Social worker, Explore available resources and support systems, Assess for adequacy in community support network, Educate family and significant other(s) on suicide prevention, Complete Psychosocial Assessment, Interpersonal group therapy.  Evaluation of Outcomes: Progressing   Progress in Treatment: Attending groups: Yes. Participating in groups: Yes. Taking medication as prescribed: Yes. Toleration medication: Yes. Family/Significant other contact made: No, will contact:  ex wife for collateral information and to complete SPE. Patient understands diagnosis: Yes. Discussing patient identified problems/goals with staff: Yes. Medical problems stabilized or resolved: Yes. Denies suicidal/homicidal ideation: Yes. Issues/concerns per patient self-inventory: No. Other: n/a   New problem(s) identified: No, Describe:  n/a  New Short Term/Long Term Goal(s):  medication management for mood stabilization; elimination of SI thoughts; development of comprehensive mental wellness/sobriety plan.   Patient Goals:  "To get myself back to where I was. To not be suicidal anymore."   Discharge Plan or Barriers: CSW assessing for appropriate referrals. Pt will referred to [facility] for medical/mental health services. MHAG pamphlet, Mobile Crisis information, and AA/NA information provided to patient for additional community support  and resources.   Reason for Continuation of Hospitalization: Anxiety Depression Medication stabilization Suicidal ideation  Estimated Length  of Stay: Monday, 03/24/18  Attendees: Patient: Bob Solomon 03/21/2018 11:49 AM  Physician: Dr. Jama Flavors MD 03/21/2018 11:49 AM  Nursing: Marchelle Folks RN 03/21/2018 11:49 AM  RN Care Manager:x 03/21/2018 11:49 AM  Social Worker: Corrie Mckusick LCSW 03/21/2018 11:49 AM  Recreational Therapist: x 03/21/2018 11:49 AM  Other: Armandina Stammer NP; Marciano Sequin NP 03/21/2018 11:49 AM  Other:  03/21/2018 11:49 AM  Other: 03/21/2018 11:49 AM    Scribe for Treatment Team: Rona Ravens, LCSW 03/21/2018 11:49 AM
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
## 2018-03-21 NOTE — Plan of Care (Signed)
  Problem: Activity: Goal: Sleeping patterns will improve Outcome: Not Progressing   Problem: Medication: Goal: Compliance with prescribed medication regimen will improve Outcome: Progressing  DAR NOTE: Patient presents with flat affect and depressed mood.  Report poor night sleep.  Denies suicidal thoughts, auditory and visual hallucinations.  Described energy level as low and concentration as poor.  Rates depression at 9, hopelessness at 9, and anxiety at 8.  Maintained on routine safety checks.  Medications given as prescribed.  Support and encouragement offered as needed.  States goal for today is "sense of self."  Patient is withdrawn and isolates to his room for majority of this shift.   Patient is safe on and off the unit.
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
## 2018-03-21 NOTE — Progress Notes (Signed)
Recreation Therapy Notes  Date: 12.27.19 Time: 0930 Location: [address] Dayroom  Group Topic: Stress Management  Goal Area(s) Addresses:  Patient will engage in stress Regulatory affairs officermanagement practice. Patient will be able to identify stress management techniques.  Intervention: Stress Management  Activity :  Mindful Meditation.  LRT introduced the stress management technique of meditation.  LRT played a meditation that allowed patients to observe the sounds around them, sensations they may have been feeling on their skin and how they were feeling in the moment.  Patients were to listen and follow along as meditation played to engage in the activity.    Education:  Stress Management, Discharge Planning.   Education Outcome: Acknowledges Education  Clinical Observations/Feedback:  Pt did not attend group.    Caroll RancherMarjette Jairus Tonne, LRT/CTRS         Caroll RancherLindsay, Mykaela Arena A 03/21/2018 11:41 AM
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
## 2018-03-21 NOTE — Tx Team (Signed)
Initial Treatment Plan 03/20/18 2100 Bob Solomon JSE:[id number]    PATIENT STRESSORS: Financial difficulties Health problems Loss of marriage - grieving divorce, unable to see children often Medication change or noncompliance   PATIENT STRENGTHS: Ability for insight Active sense of humor Communication skills General fund of knowledge Motivation for treatment/growth Work skills   PATIENT IDENTIFIED PROBLEMS:   "Figuring out how to get back to myself."    "Having what I had before."    Depression SI           DISCHARGE CRITERIA:  Improved stabilization in mood, thinking, and/or behavior Need for constant or close observation no longer present Reduction of life-threatening or endangering symptoms to within safe limits Verbal commitment to aftercare and medication compliance  PRELIMINARY DISCHARGE PLAN: Outpatient therapy Placement in alternative living arrangements Return to previous work or school arrangements  PATIENT/FAMILY INVOLVEMENT: This treatment plan has been presented to and reviewed with the patient, Bob Solomon, and/or family member.  The patient and family have been given the opportunity to ask questions and make suggestions.  Lawrence Marseilles, RN 03/20/18, [phone]

## 2018-03-21 NOTE — Progress Notes (Signed)
Adult Psychoeducational Group Note  Date:  03/21/2018 Time:  9:22 PM  Group Topic/Focus:  Wrap-Up Group:   The focus of this group is to help patients review their daily goal of treatment and discuss progress on daily workbooks.  Participation Level:  Active  Participation Quality:  Appropriate and Attentive  Affect:  Appropriate  Cognitive:  Alert and Appropriate  Insight: Appropriate and Good  Engagement in Group:  Engaged  Modes of Intervention:  Discussion  Additional Comments:  Pt said he would rate his day as a 6. The one positive thing that happened to him he had a chance to talk to other pt's today.  Charna Busman Long 03/21/2018, 9:22 PM

## 2018-03-22 MED ORDER — TRAZODONE HCL 50 MG PO TABS: 50.0000 mg | ORAL_TABLET | Freq: Every evening | ORAL | Status: DC | PRN

## 2018-03-22 NOTE — [facility] Group Notes (Signed)
LCSW Group Therapy Note  03/22/2018   10:00-11:00am   Type of Therapy and Topic:  Group Therapy: Anger Cues and Responses  Participation Level:  Active   Description of Group:   In this group, patients learned how to recognize that they are starting to become angry at any level.  They identified a recent time they became angry and how they reacted.  They identified on a thermometer what different levels of anger mean for them personally.  They discussed the emotions that were underlying their last experience of anger, and how they could examine their thoughts concerning this incident to determine if their thoughts were faulty or correct, and how that might change their feelings and actions.  We also had a significant amount of conversation around the need to take care of self first and foremost.  Therapeutic Goals: 1. Patients will remember their last incident of anger and how they felt emotionally and physically, what their thoughts were at the time, and how they behaved. 2. Patients will identify how their behavior at that time worked for them, as well as how it worked against them. 3. Patients will explore possible use of the anger thermometer to help them analyze and therefore change their feelings earlier. 4. Patients will learn that anger itself is normal and cannot be eliminated, and that healthier reactions can assist with resolving conflict rather than worsening situations.  Summary of Patient Progress:  The patient shared that he cannot remember his most recent time of anger because he is so frequently angry and said he first remembers becoming angry on a semi-permanent basis in 2008 when he was diagnosed with Multiple Sclerosis, then when he and his wife divorced after 12 years of marriage and he knew he would no longer wake up to his children in the same home.  Therapeutic Modalities:   Cognitive Behavioral Therapy  [name]

## 2018-03-22 NOTE — Progress Notes (Signed)
Pt observed sitting in the dayroom watching TV, keeping to himself.  He vaguely answered questions with Clinical research associate.  He denies SI/HI/AVH.  He voiced no needs or concerns with Clinical research associate.  Conversation with Clinical research associate was minimal.  Support and encouragement offered.  Discharge plans are in process.  Safety maintained with q15 minute checks.

## 2018-03-22 NOTE — Progress Notes (Signed)
D. Pt presents with a sad affect and calm cooperative behavior- soft spoken, but friendly and polite during interactions. Per pt's self inventory, pt rates his depression, hopelessness and anxiety a 4/7/8, respectively. Pt writes that his most important goal to work on is "getting back to work". Pt currently denies SI/HI and AVH and agrees to contact staff before acting on any harmful thoughts.  A. Labs and vitals monitored. Pt compliant with medications. Pt supported emotionally and encouraged to express concerns and ask questions.   R. Pt remains safe with 15 minute checks. Will continue POC.

## 2018-03-22 NOTE — Progress Notes (Signed)
[facility] Group Notes:  (Nursing/MHT/Case Management/Adjunct)  Date:  03/22/2018  Time:  1330  Type of Therapy:  Nurse Education  Participation Level:  Active  Participation Quality:  Appropriate  Affect:  Appropriate  Cognitive:  Appropriate  Insight:  Appropriate  Engagement in Group:  Engaged  Modes of Intervention:  Activity  Summary of Progress/Problems: The Ungame    

## 2018-03-22 NOTE — Progress Notes (Signed)
[facility] MD Progress Note  03/22/2018 1:12 PM Bob Solomon  MRN:  [id number] Subjective: Patient describes some improvement compared to admission.  At this point denies suicidal ideations.  Denies medication side effects. Objective: I have reviewed chart notes and have met with patient. 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  Presented for depression, neurovegetative symptoms of depression, and suicidal thoughts of jumping off a bridge.  Describes stressors including unsteady housing, separation, chronic medical illness (multiple sclerosis). Today reports some improvement compared to how he felt on admission.  At this time denies suicidal ideations.  Presents vaguely depressed, constricted, anxious.  Does smile at times appropriately.  He is currently on Trintellix, Lyrica (for pain), Adderall XR which he states he takes both for ADHD symptoms and for MS related anergia.  At this time denies medication side effects.  No disruptive or agitated behaviors on unit.  Polite on approach, some group participation. 12/27 EKG-NSR, heart rate 87, QTc 440 Principal Problem: MDD Diagnosis: Active Problems:   MDD (major depressive disorder)   MDD (major depressive disorder), recurrent episode, severe ([place])  Total Time spent with patient: 20 minutes  Past Psychiatric History:   Past Medical History:  Past Medical History:  Diagnosis Date  . MS (multiple sclerosis) ([place])     Past Surgical History:  Procedure Laterality Date  . KNEE SURGERY     Family History:  Family History  Family history unknown: Yes   Family Psychiatric  History:  Social History:  Social History   Substance and Sexual Activity  Alcohol Use Yes   Comment: Social     Social History   Substance and Sexual Activity  Drug Use Yes  . Types: Marijuana    Social History   Socioeconomic History  . Marital status: Divorced    Spouse name: Not on file  . Number of children: Not on file  . Years of education: Not  on file  . Highest education level: Not on file  Occupational History  . Occupation: Training and development officer  Social Needs  . Financial resource strain: Not on file  . Food insecurity:    Worry: Not on file    Inability: Not on file  . Transportation needs:    Medical: Not on file    Non-medical: Not on file  Tobacco Use  . Smoking status: Current Every Day Smoker    Packs/day: 0.50    Years: 15.00    Pack years: 7.50    Types: Cigarettes  . Smokeless tobacco: Never Used  . Tobacco comment: "about 3 per day" 02/17/2018  Substance and Sexual Activity  . Alcohol use: Yes    Comment: Social  . Drug use: Yes    Types: Marijuana  . Sexual activity: Yes    Birth control/protection: Condom  Lifestyle  . Physical activity:    Days per week: Not on file    Minutes per session: Not on file  . Stress: Not on file  Relationships  . Social connections:    Talks on phone: Not on file    Gets together: Not on file    Attends religious service: Not on file    Active member of club or organization: Not on file    Attends meetings of clubs or organizations: Not on file    Relationship status: Not on file  Other Topics Concern  . Not on file  Social History Narrative  . Not on file   Additional Social History:   Sleep: Improving  Appetite:  Fair  Current Medications: Current Facility-Administered Medications  Medication Dose Route Frequency Provider Last Rate Last Dose  . acetaminophen (TYLENOL) tablet 650 mg  650 mg Oral Q6H PRN Laverle Hobby, PA-C      . alum & mag hydroxide-simeth (MAALOX/MYLANTA) 200-200-20 MG/5ML suspension 30 mL  30 mL Oral Q4H PRN Laverle Hobby, PA-C      . amphetamine-dextroamphetamine (ADDERALL XR) 24 hr capsule 20 mg  20 mg Oral Daily Johnn Hai, MD   20 mg at 03/22/18 0814  . feeding supplement (ENSURE ENLIVE) (ENSURE ENLIVE) liquid 237 mL  237 mL Oral BID BM Kaylena Pacifico A, MD   237 mL at 03/22/18 1037  . hydrOXYzine (ATARAX/VISTARIL) tablet 25 mg  25 mg  Oral Q6H PRN Patriciaann Clan E, PA-C   25 mg at 03/21/18 1710  . magnesium hydroxide (MILK OF MAGNESIA) suspension 30 mL  30 mL Oral Daily PRN Patriciaann Clan E, PA-C      . nicotine (NICODERM CQ - dosed in mg/24 hours) patch 21 mg  21 mg Transdermal Daily Genevie Elman, Myer Peer, MD   21 mg at 03/22/18 0813  . pregabalin (LYRICA) capsule 50 mg  50 mg Oral TID Johnn Hai, MD   50 mg at 03/22/18 1224  . prenatal multivitamin tablet 1 tablet  1 tablet Oral Q1200 Johnn Hai, MD   1 tablet at 03/22/18 1224  . temazepam (RESTORIL) capsule 30 mg  30 mg Oral QHS Johnn Hai, MD   30 mg at 03/21/18 2128  . traZODone (DESYREL) tablet 50 mg  50 mg Oral QHS PRN Tahjai Schetter, Myer Peer, MD      . vortioxetine HBr (TRINTELLIX) tablet 10 mg  10 mg Oral Daily Johnn Hai, MD   10 mg at 03/22/18 0815    Lab Results:  Results for orders placed or performed during the hospital encounter of 03/20/18 (from the past 48 hour(s))  Comprehensive metabolic panel     Status: Abnormal   Collection Time: 03/20/18  1:20 PM  Result Value Ref Range   Sodium 139 135 - 145 mmol/L   Potassium 4.2 3.5 - 5.1 mmol/L   Chloride 109 98 - 111 mmol/L   CO2 23 22 - 32 mmol/L   Glucose, Bld 103 (H) 70 - 99 mg/dL   BUN 7 6 - 20 mg/dL   Creatinine, Ser 1.00 0.61 - 1.24 mg/dL   Calcium 8.5 (L) 8.9 - 10.3 mg/dL   Total Protein 6.4 (L) 6.5 - 8.1 g/dL   Albumin 3.6 3.5 - 5.0 g/dL   AST 17 15 - 41 U/L   ALT 13 0 - 44 U/L   Alkaline Phosphatase 57 38 - 126 U/L   Total Bilirubin 0.9 0.3 - 1.2 mg/dL   GFR calc non Af Amer >60 >60 mL/min   GFR calc Af Amer >60 >60 mL/min   Anion gap 7 5 - 15    Comment: Performed at [facility], [place] [address] [place], [place] [address]  Ethanol     Status: None   Collection Time: 03/20/18  1:20 PM  Result Value Ref Range   Alcohol, Ethyl (B) <10 <10 mg/dL    Comment: (NOTE) Lowest detectable limit for serum alcohol is 10 mg/dL. For medical purposes only. Performed at [facility], [place] [address], [place], [place] [address]   Salicylate level     Status: None   Collection Time: 03/20/18  1:20 PM  Result Value Ref Range  Salicylate Lvl <4.6 2.8 - 30.0 mg/dL    Comment: Performed at [facility], [place] [address] [place], [place] [address]  Acetaminophen level     Status: Abnormal   Collection Time: 03/20/18  1:20 PM  Result Value Ref Range   Acetaminophen (Tylenol), Serum <10 (L) 10 - 30 ug/mL    Comment: (NOTE) Therapeutic concentrations vary significantly. A range of 10-30 ug/mL  may be an effective concentration for many patients. However, some  are best treated at concentrations outside of this range. Acetaminophen concentrations >150 ug/mL at 4 hours after ingestion  and >50 ug/mL at 12 hours after ingestion are often associated with  toxic reactions. Performed at [facility], [place] [address] [place], [place] [address]   cbc     Status: None   Collection Time: 03/20/18  1:20 PM  Result Value Ref Range   WBC 8.6 4.0 - 10.5 K/uL   RBC 4.30 4.22 - 5.81 MIL/uL   Hemoglobin 13.3 13.0 - 17.0 g/dL   HCT 41.2 39.0 - 52.0 %   MCV 95.8 80.0 - 100.0 fL   MCH 30.9 26.0 - 34.0 pg   MCHC 32.3 30.0 - 36.0 g/dL   RDW 13.2 11.5 - 15.5 %   Platelets 222 150 - 400 K/uL   nRBC 0.0 0.0 - 0.2 %    Comment: Performed at [facility], [place] [address] [place], [place] [address]  Rapid urine drug screen (hospital performed)     Status: Abnormal   Collection Time: 03/20/18  3:44 PM  Result Value Ref Range   Opiates NONE DETECTED NONE DETECTED   Cocaine NONE DETECTED NONE DETECTED   Benzodiazepines NONE DETECTED NONE DETECTED   Amphetamines NONE DETECTED NONE DETECTED   Tetrahydrocannabinol POSITIVE (A) NONE DETECTED   Barbiturates NONE DETECTED NONE DETECTED    Comment: (NOTE) DRUG SCREEN FOR MEDICAL PURPOSES ONLY.  IF CONFIRMATION IS NEEDED FOR ANY PURPOSE, NOTIFY LAB WITHIN 5  DAYS. LOWEST DETECTABLE LIMITS FOR URINE DRUG SCREEN Drug Class                     Cutoff (ng/mL) Amphetamine and metabolites    1000 Barbiturate and metabolites    200 Benzodiazepine                 001 Tricyclics and metabolites     300 Opiates and metabolites        300 Cocaine and metabolites        300 THC                            50 Performed at [facility], [place] [address] [place], [place] [address]     Blood Alcohol level:  Lab Results  Component Value Date   ETH <10 03/20/2018   ETH <10 96/75/9163    Metabolic Disorder Labs: No results found for: HGBA1C, MPG No results found for: PROLACTIN No results found for: CHOL, TRIG, HDL, CHOLHDL, VLDL, LDLCALC  Physical Findings: AIMS: Facial and Oral Movements Muscles of Facial Expression: None, normal Lips and Perioral Area: None, normal Jaw: None, normal Tongue: None, normal,Extremity Movements Upper (arms, wrists, hands, fingers): None, normal Lower (legs, knees, ankles, toes): None, normal, Trunk Movements Neck, shoulders, hips: None, normal, Overall Severity Severity of abnormal movements (highest score from questions above): None, normal Incapacitation due to abnormal movements: None, normal Patient's awareness of abnormal  movements (rate only patient's report): No Awareness, Dental Status Current problems with teeth and/or dentures?: No Does patient usually wear dentures?: No  CIWA:    COWS:     Musculoskeletal: Strength & Muscle Tone: within normal limits Gait & Station: normal Patient leans: N/A  Psychiatric Specialty Exam: Physical Exam  ROS describes frequent fatigue which he attributes to MS, no chest pain, no shortness of breath, no vomiting  Blood pressure 120/88, pulse 84, temperature 98.6 F (37 C), temperature source Oral, resp. rate 18, height '5\' 11"'$  (1.803 m), weight 76.7 kg.Body mass index is 23.57 kg/m.  General Appearance: Fairly Groomed  Eye Contact:  Good   Speech:  Normal Rate  Volume:  Normal  Mood:  Reports some improvement, remains vaguely depressed, anxious  Affect:  Congruent  Thought Process:  Linear and Descriptions of Associations: Intact  Orientation:  Full (Time, Place, and Person)  Thought Content:  No hallucinations, no delusions, not internally preoccupied  Suicidal Thoughts:  No at this time denies suicidal or self-injurious ideations, contracts for safety on unit, also denies any homicidal or violent ideations  Homicidal Thoughts:  No  Memory:  Recent and remote grossly intact  Judgement: Improving  Insight:  Fair/improving  Psychomotor Activity:  Normal  Concentration:  Concentration: Good and Attention Span: Good  Recall:  Good  Fund of Knowledge:  Good  Language:  Good  Akathisia:  Negative  Handed:  Right  AIMS (if indicated):     Assets:  Communication Skills Desire for Improvement Resilience  ADL's:  Intact  Cognition:  WNL  Sleep:  Number of Hours: 6.75   Assessment -  32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  Presented for depression, neurovegetative symptoms of depression, and suicidal thoughts of jumping off a bridge.  Describes stressors including unsteady housing, separation, chronic medical illness (multiple sclerosis).  Patient reports he is feeling better than he did on admission.  Presents vaguely depressed, anxious but affect tends to improve during session.  Denies suicidal ideations at this time.  Tolerating medications well thus far.   Treatment Plan Summary: Daily contact with patient to assess and evaluate symptoms and progress in treatment, Medication management, Plan Inpatient treatment and Medications as below Encourage group and milieu participation to work on coping skills and symptom reduction Continue Trintellix 10 mg daily for depression Continue Temazepam 30 mg nightly for insomnia Discontinue Trazodone as on Temazepam Continue Lyrica 50 mg 3 times daily for pain Continue  Vistaril 25 mg every 6 hours PRN for anxiety Continue Adderall XR 20 mg daily for ADHD symptoms Treatment team working on disposition planning options Check TSH  Jenne Campus, MD 03/22/2018, 1:12 PM

## 2018-03-23 LAB — TSH: TSH: 0.862 u[IU]/mL (ref 0.350–4.500)

## 2018-03-23 MED ORDER — HYDROXYZINE HCL 50 MG PO TABS
50.0000 mg | ORAL_TABLET | Freq: Three times a day (TID) | ORAL | Status: DC | PRN
  Administered 2018-03-23 – 2018-03-24 (×4): 50 mg via ORAL
  Filled 2018-03-23 (×4): qty 1

## 2018-03-23 NOTE — Progress Notes (Signed)
Nutrition Brief Note  Patient identified on the Malnutrition Screening Tool (MST) Report  Pt with minimal weight loss per records. Pt does not have an appetite. Will continue supplements at this time.  Wt Readings from Last 15 Encounters:  03/20/18 76.7 kg  02/17/18 77 kg  02/12/18 77 kg  09/17/11 77.1 kg    Body mass index is 23.57 kg/m. Patient meets criteria for normal based on current BMI.   Labs and medications reviewed.   No further nutrition interventions warranted at this time. If nutrition issues arise, please consult RD.   Tilda FrancoLindsey Katharyn Schauer, MS, RD, LDN [facility] Clinical Dietitian Pager: [phone] After Hours Pager: [phone]

## 2018-03-23 NOTE — [facility] Group Notes (Signed)
[facility] LCSW Group Therapy Note  03/23/2018  10:00-11:00AM  Type of Therapy and Topic:  Group Therapy:  Adding Supports Including Being Your Own Support  Participation Level:  Active   Description of Group:  Patients in this group were introduced to the concept that additional supports including self-support are an essential part of recovery.  A song entitled "I Need Help!" was played and a group discussion was held in reaction to the idea of needing to add supports.  A song entitled "My Own Hero" was played and a group discussion ensued in which patients stated they could relate to the song and it inspired them to realize they have be willing to help themselves in order to succeed, because other people cannot achieve sobriety or stability for them.  We discussed adding a variety of healthy supports to address the various needs in their lives.  A song was played called "I Know Where I've Been" toward the end of group and used to conduct an inspirational wrap-up to group of remembering how far they have already come in their journey.  Therapeutic Goals: 1)  demonstrate the importance of being a part of one's own support system 2)  discuss reasons people in one's life may eventually be unable to be continually supportive  3)  identify the patient's current support system and   4)  elicit commitments to add healthy supports and to become more conscious of being self-supportive   Summary of Patient Progress:  The patient expressed that his healthy supports are his ex-wife and 4 minor children, while he himself is his most unhealthy support because he always helps others and not himself.  He stated he needs to rely on himself and go to see his psychologist for help in figuring out how to do this.  He stated, "I miss me, and this isn't me."   Therapeutic Modalities:   Motivational Interviewing Activity  Lynnell ChadMareida J Grossman-Orr

## 2018-03-23 NOTE — Progress Notes (Signed)
D. Pt reports increasing anxiety today- has been visible in the dayroom interacting appropriately with peers and staff. Per pt's self inventory, pt rates his depression, hopelessness and anxiety an 10/30/08, respectively. Pt writes that his most important goal today is "finding a place" and writes that he will "call around" to help him meet that goal.   Pt currently denies SI/HI and AVH and agrees to contact staff before acting on any harmful thoughts.  A. Labs and vitals monitored. Pt compliant with medications. Pt supported emotionally and encouraged to express concerns and ask questions.   R. Pt remains safe with 15 minute checks. Will continue POC.

## 2018-03-23 NOTE — Progress Notes (Signed)
Nursing note 7p-7a  Pt observed interacting with peers on unit this shift. Displayed flat  affect and depressed mood upon interaction with this Clinical research associatewriter. Pt denies pain ,denies SI/HI, and also denies any audio or visual hallucinations at this time.Pt complains of anxiety see MAR for prn medication administration. Pt is able to verbally contract for safety with this RN.  Pt educated on falls and encouraged to wear nonskid socks when ambulating in the halls. Pt also encouraged to call for help if feeling weak or dizzy. Pt verbalized understanding of all education provided. Pt is now resting in bed with eyes closed, with no signs or symptoms of pain or distress noted. Pt continues to remain safe on the unit and is observed by rounding every 15 min. RN will continue to monitor.

## 2018-03-23 NOTE — Progress Notes (Signed)
Adult Psychoeducational Group Note  Date:  03/23/2018 Time:  4:47 AM  Group Topic/Focus:  Wrap-Up Group:   The focus of this group is to help patients review their daily goal of treatment and discuss progress on daily workbooks.  Participation Level:  Active  Participation Quality:  Appropriate and Attentive  Affect:  Appropriate  Cognitive:  Appropriate  Insight: Appropriate and Good  Engagement in Group:  Engaged  Modes of Intervention:  Discussion  Additional Comments:  Pt.'s overall day was a 7. The one positive thing that happened he went to dinner. He interact other patients on the unit and made friends.  Charna Busmanobinson, Abrian [facility] 03/23/2018, 4:47 AM

## 2018-03-23 NOTE — [facility] Group Notes (Signed)
[facility] Group Notes:  (Nursing/MHT/Case Management/Adjunct)  Date:  03/23/2018  Time:  5:59 PM  Type of Therapy:  Nurse Education  Participation Level:  Active  Participation Quality:  Appropriate and Attentive  Affect:  Appropriate  Cognitive:  Alert and Appropriate  Insight:  Appropriate and Good  Engagement in Group:  Engaged  Modes of Intervention:  Activity and Discussion  Summary of Progress/Problems: In this group, patients were educated on how to express their needs to others in a healthy way. They explored unhealthy communication and new methods to manage stressful relationship situations. Patients discussed stress management skills and how to incorporate them into their daily life. The patient was active and participated readily.   Kirstie Mirza 03/23/2018, 5:59 PM

## 2018-03-23 NOTE — Progress Notes (Signed)
[facility] MD Progress Note  03/23/2018 1:14 PM Bob Solomon  MRN:  [id number] Subjective: Patient reports partially improved mood compared to admission, does describe persistent anxiety and states he often feels significantly/subjectively anxious, apprehensive.  Denies suicidal ideations.  Denies medication side effects. Objective: I have reviewed chart notes and have met with patient. 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  Presented for depression, neurovegetative symptoms of depression, and suicidal thoughts of jumping off a bridge.  Describes stressors including unsteady housing, separation, chronic medical illness (multiple sclerosis). Patient presents alert, attentive, describes improving mood, affect is anxious but reactive and smiles briefly at times appropriately.  No suicidal ideations. He is currently on Trintellix, Lyrica, Temazepam nightly for sleep.  Thus far tolerating these medications well. He has been able in dayroom, participating in some groups, cooperative on approach, no agitated or disruptive behaviors.  Principal Problem: MDD Diagnosis: Active Problems:   MDD (major depressive disorder)   MDD (major depressive disorder), recurrent episode, severe ([place])  Total Time spent with patient: 15 minutes  Past Psychiatric History:   Past Medical History:  Past Medical History:  Diagnosis Date  . MS (multiple sclerosis) ([place])     Past Surgical History:  Procedure Laterality Date  . KNEE SURGERY     Family History:  Family History  Family history unknown: Yes   Family Psychiatric  History:  Social History:  Social History   Substance and Sexual Activity  Alcohol Use Yes   Comment: Social     Social History   Substance and Sexual Activity  Drug Use Yes  . Types: Marijuana    Social History   Socioeconomic History  . Marital status: Divorced    Spouse name: Not on file  . Number of children: Not on file  . Years of education: Not on file  . Highest  education level: Not on file  Occupational History  . Occupation: Training and development officer  Social Needs  . Financial resource strain: Not on file  . Food insecurity:    Worry: Not on file    Inability: Not on file  . Transportation needs:    Medical: Not on file    Non-medical: Not on file  Tobacco Use  . Smoking status: Current Every Day Smoker    Packs/day: 0.50    Years: 15.00    Pack years: 7.50    Types: Cigarettes  . Smokeless tobacco: Never Used  . Tobacco comment: "about 3 per day" 02/17/2018  Substance and Sexual Activity  . Alcohol use: Yes    Comment: Social  . Drug use: Yes    Types: Marijuana  . Sexual activity: Yes    Birth control/protection: Condom  Lifestyle  . Physical activity:    Days per week: Not on file    Minutes per session: Not on file  . Stress: Not on file  Relationships  . Social connections:    Talks on phone: Not on file    Gets together: Not on file    Attends religious service: Not on file    Active member of club or organization: Not on file    Attends meetings of clubs or organizations: Not on file    Relationship status: Not on file  Other Topics Concern  . Not on file  Social History Narrative  . Not on file   Additional Social History:   Sleep: Improving  Appetite:  Improving  Current Medications: Current Facility-Administered Medications  Medication Dose Route Frequency Provider Last Rate Last Dose  .  acetaminophen (TYLENOL) tablet 650 mg  650 mg Oral Q6H PRN Laverle Hobby, PA-C      . alum & mag hydroxide-simeth (MAALOX/MYLANTA) 200-200-20 MG/5ML suspension 30 mL  30 mL Oral Q4H PRN Patriciaann Clan E, PA-C      . amphetamine-dextroamphetamine (ADDERALL XR) 24 hr capsule 20 mg  20 mg Oral Daily Johnn Hai, MD   20 mg at 03/23/18 0842  . feeding supplement (ENSURE ENLIVE) (ENSURE ENLIVE) liquid 237 mL  237 mL Oral BID BM Vidur Knust A, MD   237 mL at 03/23/18 0845  . hydrOXYzine (ATARAX/VISTARIL) tablet 25 mg  25 mg Oral Q6H PRN  Patriciaann Clan E, PA-C   25 mg at 03/23/18 1118  . magnesium hydroxide (MILK OF MAGNESIA) suspension 30 mL  30 mL Oral Daily PRN Patriciaann Clan E, PA-C      . nicotine (NICODERM CQ - dosed in mg/24 hours) patch 21 mg  21 mg Transdermal Daily Keniya Schlotterbeck, Myer Peer, MD   21 mg at 03/23/18 0845  . pregabalin (LYRICA) capsule 50 mg  50 mg Oral TID Johnn Hai, MD   50 mg at 03/23/18 0940  . prenatal multivitamin tablet 1 tablet  1 tablet Oral Q1200 Johnn Hai, MD   1 tablet at 03/22/18 1224  . temazepam (RESTORIL) capsule 30 mg  30 mg Oral QHS Johnn Hai, MD   30 mg at 03/22/18 2224  . vortioxetine HBr (TRINTELLIX) tablet 10 mg  10 mg Oral Daily Johnn Hai, MD   10 mg at 03/23/18 6237    Lab Results:  No results found for this or any previous visit (from the past 69 hour(s)).  Blood Alcohol level:  Lab Results  Component Value Date   ETH <10 03/20/2018   ETH <10 62/83/1517    Metabolic Disorder Labs: No results found for: HGBA1C, MPG No results found for: PROLACTIN No results found for: CHOL, TRIG, HDL, CHOLHDL, VLDL, LDLCALC  Physical Findings: AIMS: Facial and Oral Movements Muscles of Facial Expression: None, normal Lips and Perioral Area: None, normal Jaw: None, normal Tongue: None, normal,Extremity Movements Upper (arms, wrists, hands, fingers): None, normal Lower (legs, knees, ankles, toes): None, normal, Trunk Movements Neck, shoulders, hips: None, normal, Overall Severity Severity of abnormal movements (highest score from questions above): None, normal Incapacitation due to abnormal movements: None, normal Patient's awareness of abnormal movements (rate only patient's report): No Awareness, Dental Status Current problems with teeth and/or dentures?: No Does patient usually wear dentures?: No  CIWA:    COWS:     Musculoskeletal: Strength & Muscle Tone: within normal limits Gait & Station: normal Patient leans: N/A  Psychiatric Specialty Exam: Physical Exam  ROS  no headache, no chest pain, no shortness of breath, no vomiting  Blood pressure 120/88, pulse 84, temperature 98.6 F (37 C), temperature source Oral, resp. rate 18, height 5' 11" (1.803 m), weight 76.7 kg.Body mass index is 23.57 kg/m.  General Appearance: Improving grooming  Eye Contact:  Good  Speech:  Normal Rate  Volume:  Normal  Mood:  Describes mood as partially improved, less depressed  Affect:  Congruent and Smiles at times appropriately during session, remains anxious  Thought Process:  Linear and Descriptions of Associations: Intact  Orientation:  Full (Time, Place, and Person)  Thought Content:  No hallucinations, no delusions, not internally preoccupied  Suicidal Thoughts:  No at this time denies suicidal or self-injurious ideations, contracts for safety on unit, also denies any homicidal or violent ideations  Homicidal  Thoughts:  No  Memory:  Recent and remote grossly intact  Judgement: Improving  Insight:  Fair/improving  Psychomotor Activity:  Normal  Concentration:  Concentration: Good and Attention Span: Good  Recall:  Good  Fund of Knowledge:  Good  Language:  Good  Akathisia:  Negative  Handed:  Right  AIMS (if indicated):     Assets:  Communication Skills Desire for Improvement Resilience  ADL's:  Intact  Cognition:  WNL  Sleep:  Number of Hours: 6.5   Assessment -  32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  Presented for depression, neurovegetative symptoms of depression, and suicidal thoughts of jumping off a bridge.  Describes stressors including unsteady housing, separation, chronic medical illness (multiple sclerosis).  Patient presents with partially improved mood and range of affect.  Describes lingering/persistent anxiety and presents vaguely apprehensive.  Thus far tolerating medications well. TSH was ordered yesterday, not done this a.m.  Will reorder for this evening.  Treatment Plan Summary: Daily contact with patient to assess and  evaluate symptoms and progress in treatment, Medication management, Plan Inpatient treatment and Medications as below  Treatment and reviewed as below today 12/29 Encourage group and milieu participation to work on coping skills and symptom reduction Continue Trintellix 10 mg daily for depression Continue Temazepam 30 mg nightly for insomnia Continue Lyrica 50 mg 3 times daily for pain Increase Vistaril to 50 mg every 8 hours PRN for anxiety Continue Adderall XR 20 mg daily for ADHD symptoms Treatment team working on disposition planning options TSH pending   Jenne Campus, MD 03/23/2018, 1:14 PM   Patient ID: Bob Solomon, male   DOB: 20-Dec-1985, 32 y.o.   MRN: [id number]

## 2018-03-24 NOTE — [facility] Group Notes (Signed)
LCSW Group Therapy Note 03/24/2018 11:52 AM  Type of Therapy and Topic: Group Therapy: Overcoming Obstacles  Participation Level: Active  Description of Group:  In this group patients will be encouraged to explore what they see as obstacles to their own wellness and recovery. They will be guided to discuss their thoughts, feelings, and behaviors related to these obstacles. The group will process together ways to cope with barriers, with attention given to specific choices patients can make. Each patient will be challenged to identify changes they are motivated to make in order to overcome their obstacles. This group will be process-oriented, with patients participating in exploration of their own experiences as well as giving and receiving support and challenge from other group members.  Therapeutic Goals: 1. Patient will identify personal and current obstacles as they relate to admission. 2. Patient will identify barriers that currently interfere with their wellness or overcoming obstacles.  3. Patient will identify feelings, thought process and behaviors related to these barriers. 4. Patient will identify two changes they are willing to make to overcome these obstacles:   Summary of Patient Progress  Bob Solomon was engaged and participated throughout the group session. Bob Solomon states that his main obstacle is his housing situation. Bob Solomon reports that although he has a good job, he continues to struggle with finding housing.     Therapeutic Modalities:  Cognitive Behavioral Therapy Solution Focused Therapy Motivational Interviewing Relapse Prevention Therapy   Alcario Drought Clinical Social Worker

## 2018-03-24 NOTE — Progress Notes (Signed)
Adult Psychoeducational Group Note  Date:  03/24/2018 Time:  5:11 AM  Group Topic/Focus:  Wrap-Up Group:   The focus of this group is to help patients review their daily goal of treatment and discuss progress on daily workbooks.  Participation Level:  Active  Participation Quality:  Appropriate  Affect:  Anxious and Appropriate  Cognitive:  Alert and Appropriate  Insight: Appropriate and Good  Engagement in Group:  Engaged  Modes of Intervention:  Discussion  Additional Comments:  Pt said his day was a 6. The one positive that happen he became social and interaction with other patients.                                                                                                               Charna Busman Long 03/24/2018, 5:11 AM

## 2018-03-24 NOTE — Progress Notes (Signed)
Recreation Therapy Notes  Date: 12.30.19 Time: 0930 Location: [address] Dayroom  Group Topic: Stress Management  Goal Area(s) Addresses:  Patient will identify stress management techniques. Patient will identify benefits of using stress management techniques post d/c.  Behavioral Response: Engaged  Intervention: Stress Management  Activity :  Guided Imagery.  LRT introduced the stress management technique of guided imagery.  LRT read a scrip that guided patients on the beach to relax by the peacefully waves.  Education:  Stress Management, Discharge Planning.   Education Outcome: Acknowledges Education  Clinical Observations/Feedback:  Pt attended and participated in group.     Caroll Rancher, LRT/CTRS         Caroll Rancher A 03/24/2018 10:43 AM

## 2018-03-24 NOTE — [facility] Group Notes (Signed)
Adult Psychoeducational Group Note  Date:  03/24/2018 Time:  9:53 PM  Group Topic/Focus:  Wrap-Up Group:   The focus of this group is to help patients review their daily goal of treatment and discuss progress on daily workbooks.  Participation Level:  Active  Participation Quality:  Appropriate and Attentive  Affect:  Appropriate  Cognitive:  Alert and Appropriate  Insight: Appropriate and Good  Engagement in Group:  Engaged  Modes of Intervention:  Discussion and Education  Additional Comments:  Pt attended and participated in wrap up group this evening. Pt rated their day a 6/10, due to them expecting to go home today, but not going home until tomorrow. Pt goal is to be "good when they leave here". Pt feels that being here is what they needed and they feel really good about coming here.    [name] 03/24/2018, 9:53 PM

## 2018-03-24 NOTE — Progress Notes (Signed)
Patient ID: Bob Solomon, male   DOB: 1985/08/15, 32 y.o.   MRN: [id number]  Pt currently presents with a irritable affect and anxious behavior. Pt reports to writer that their goal is to "getting a place." He intends to do so by "making calls." Pt reports ongoing anxiety. Pt reports good sleep with current medication regimen.   Pt provided with medications per providers orders. Pt's labs and vitals were monitored throughout the night. Pt supported emotionally and encouraged to express concerns and questions. Pt educated on medications and indications.   Pt's safety ensured with 15 minute and environmental checks. Pt currently denies SI/HI and A/V hallucinations. Pt verbally agrees to seek staff if SI/HI or A/VH occurs and to consult with staff before acting on any harmful thoughts. Will continue POC.

## 2018-03-24 NOTE — Progress Notes (Signed)
[facility] MD Progress Note  03/24/2018 4:39 PM Bob Solomon  MRN:  [id number] Subjective: Patient reports feeling better than he did on admission.  As he improves he is focusing more on discharge, hoping for discharge soon.  Currently denies suicidal ideations.  Does not endorse medication side effects.. History of MS, states his most frequent symptom is fatigue, but reports that it is currently mostly in remission.  Objective: I have reviewed chart notes and have met with patient. 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  Presented for depression, neurovegetative symptoms of depression, and suicidal thoughts of jumping off a bridge.  Describes stressors including unsteady housing, separation, chronic medical illness (multiple sclerosis). Patient is presenting with gradually improving mood and a more reactive affect.  He has been going to groups and is visible on unit.  Behavior on unit in good control.  Today denies suicidal ideations, although chart notes indicate he had endorsed some passive SI earlier today( currently denies) .  Presents future oriented Currently tolerating medications well-on Trintellix, Lyrica, Adderall, Restoril.  TSH- 0.862  Principal Problem: MDD Diagnosis: Active Problems:   MDD (major depressive disorder)   MDD (major depressive disorder), recurrent episode, severe ([place])  Total Time spent with patient: 15 minutes  Past Psychiatric History:   Past Medical History:  Past Medical History:  Diagnosis Date  . MS (multiple sclerosis) ([place])     Past Surgical History:  Procedure Laterality Date  . KNEE SURGERY     Family History:  Family History  Family history unknown: Yes   Family Psychiatric  History:  Social History:  Social History   Substance and Sexual Activity  Alcohol Use Yes   Comment: Social     Social History   Substance and Sexual Activity  Drug Use Yes  . Types: Marijuana    Social History   Socioeconomic History  . Marital  status: Divorced    Spouse name: Not on file  . Number of children: Not on file  . Years of education: Not on file  . Highest education level: Not on file  Occupational History  . Occupation: Training and development officer  Social Needs  . Financial resource strain: Not on file  . Food insecurity:    Worry: Not on file    Inability: Not on file  . Transportation needs:    Medical: Not on file    Non-medical: Not on file  Tobacco Use  . Smoking status: Current Every Day Smoker    Packs/day: 0.50    Years: 15.00    Pack years: 7.50    Types: Cigarettes  . Smokeless tobacco: Never Used  . Tobacco comment: "about 3 per day" 02/17/2018  Substance and Sexual Activity  . Alcohol use: Yes    Comment: Social  . Drug use: Yes    Types: Marijuana  . Sexual activity: Yes    Birth control/protection: Condom  Lifestyle  . Physical activity:    Days per week: Not on file    Minutes per session: Not on file  . Stress: Not on file  Relationships  . Social connections:    Talks on phone: Not on file    Gets together: Not on file    Attends religious service: Not on file    Active member of club or organization: Not on file    Attends meetings of clubs or organizations: Not on file    Relationship status: Not on file  Other Topics Concern  . Not on file  Social History  Narrative  . Not on file   Additional Social History:   Sleep: Good  Appetite:  Good  Current Medications: Current Facility-Administered Medications  Medication Dose Route Frequency Provider Last Rate Last Dose  . acetaminophen (TYLENOL) tablet 650 mg  650 mg Oral Q6H PRN Simon, Spencer E, PA-C      . alum & mag hydroxide-simeth (MAALOX/MYLANTA) 200-200-20 MG/5ML suspension 30 mL  30 mL Oral Q4H PRN Simon, Spencer E, PA-C      . amphetamine-dextroamphetamine (ADDERALL XR) 24 hr capsule 20 mg  20 mg Oral Daily Farah, Brian, MD   20 mg at 03/24/18 0827  . feeding supplement (ENSURE ENLIVE) (ENSURE ENLIVE) liquid 237 mL  237 mL Oral BID BM  Cobos, Fernando A, MD   237 mL at 03/24/18 1600  . hydrOXYzine (ATARAX/VISTARIL) tablet 50 mg  50 mg Oral TID PRN Cobos, Fernando A, MD   50 mg at 03/24/18 0831  . magnesium hydroxide (MILK OF MAGNESIA) suspension 30 mL  30 mL Oral Daily PRN Simon, Spencer E, PA-C      . nicotine (NICODERM CQ - dosed in mg/24 hours) patch 21 mg  21 mg Transdermal Daily Cobos, Fernando A, MD   21 mg at 03/24/18 0827  . pregabalin (LYRICA) capsule 50 mg  50 mg Oral TID Farah, Brian, MD   50 mg at 03/24/18 1621  . prenatal multivitamin tablet 1 tablet  1 tablet Oral Q1200 Farah, Brian, MD   1 tablet at 03/24/18 1153  . temazepam (RESTORIL) capsule 30 mg  30 mg Oral QHS Farah, Brian, MD   30 mg at 03/23/18 2159  . vortioxetine HBr (TRINTELLIX) tablet 10 mg  10 mg Oral Daily Farah, Brian, MD   10 mg at 03/24/18 0831    Lab Results:  Results for orders placed or performed during the hospital encounter of 03/20/18 (from the past 48 hour(s))  TSH     Status: None   Collection Time: 03/23/18  6:32 PM  Result Value Ref Range   TSH 0.862 0.350 - 4.500 uIU/mL    Comment: Performed by a 3rd Generation assay with a functional sensitivity of <=0.01 uIU/mL. Performed at [facility], [address] [place], [place] [address]     Blood Alcohol level:  Lab Results  Component Value Date   ETH <10 03/20/2018   ETH <10 02/10/2018    Metabolic Disorder Labs: No results found for: HGBA1C, MPG No results found for: PROLACTIN No results found for: CHOL, TRIG, HDL, CHOLHDL, VLDL, LDLCALC  Physical Findings: AIMS: Facial and Oral Movements Muscles of Facial Expression: None, normal Lips and Perioral Area: None, normal Jaw: None, normal Tongue: None, normal,Extremity Movements Upper (arms, wrists, hands, fingers): None, normal Lower (legs, knees, ankles, toes): None, normal, Trunk Movements Neck, shoulders, hips: None, normal, Overall Severity Severity of abnormal movements (highest score from  questions above): None, normal Incapacitation due to abnormal movements: None, normal Patient's awareness of abnormal movements (rate only patient's report): No Awareness, Dental Status Current problems with teeth and/or dentures?: No Does patient usually wear dentures?: No  CIWA:    COWS:     Musculoskeletal: Strength & Muscle Tone: within normal limits Gait & Station: normal Patient leans: N/A  Psychiatric Specialty Exam: Physical Exam  ROS no headache, no chest pain, no shortness of breath, no vomiting  Blood pressure 113/70, pulse 96, temperature 98.1 F (36.7 C), temperature source Oral, resp. rate 18, height 5' 11" (1.803 m), weight 76.7 kg.Body mass   index is 23.57 kg/m.  General Appearance: Well Groomed  Eye Contact:  Good  Speech:  Normal Rate  Volume:  Normal  Mood:  Reports improving mood, states he feels significantly better than he did prior to admission  Affect:  More reactive, becoming brighter  Thought Process:  Linear and Descriptions of Associations: Intact  Orientation:  Full (Time, Place, and Person)  Thought Content:  No hallucinations, no delusions, not internally preoccupied  Suicidal Thoughts:  No at this time denies suicidal or self-injurious ideations, contracts for safety on unit, also denies any homicidal or violent ideations  Homicidal Thoughts:  No  Memory:  Recent and remote grossly intact  Judgement: Improving  Insight:  Fair/improving  Psychomotor Activity:  Normal  Concentration:  Concentration: Good and Attention Span: Good  Recall:  Good  Fund of Knowledge:  Good  Language:  Good  Akathisia:  Negative  Handed:  Right  AIMS (if indicated):     Assets:  Communication Skills Desire for Improvement Resilience  ADL's:  Intact  Cognition:  WNL  Sleep:  Number of Hours: 6.75   Assessment -  32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  Presented for depression, neurovegetative symptoms of depression, and suicidal thoughts of jumping off  a bridge.  Describes stressors including unsteady housing, separation, chronic medical illness (multiple sclerosis).   Patient is currently presenting with gradually improving mood and a fuller/more reactive affect.  As he improves he is becoming more future oriented and hopeful for discharge soon.  Today denies suicidal ideations.  Thus far tolerating Trintellix well.  Denies medication side effects.  Will taper temazepam, which was started on admission for insomnia, reviewed rationale to avoid long-term benzodiazepine use.   Treatment Plan Summary: Daily contact with patient to assess and evaluate symptoms and progress in treatment, Medication management, Plan Inpatient treatment and Medications as below  Treatment and reviewed as below today 12/30 Encourage group and milieu participation to work on coping skills and symptom reduction Continue Trintellix 10 mg daily for depression Decrease  Temazepam to 15  mg nightly for insomnia Continue Lyrica 50 mg 3 times daily for pain Continue Vistaril to 50 mg every 8 hours PRN for anxiety Continue Adderall XR 20 mg daily for ADHD symptoms Treatment team working on disposition planning options   Jenne Campus, MD 03/24/2018, 4:39 PM   Patient ID: Hector Brunswick, male   DOB: 29-Apr-1985, 32 y.o.   MRN: [id number]

## 2018-03-24 NOTE — Progress Notes (Signed)
Nursing note 7p-7a  Pt observed interacting with peers on unit this shift. Displayed a flat affect and mood upon interaction with this Clinical research associate. Pt denies pain ,denies HI, and also denies any audio or visual hallucinations at this time. Pt endorses passive SI. Stated that he is worried about finding a place to live and would like to speak to social work.   Pt is able to verbally contract for safety with this RN. Goal: "work with SW to find a place to live"  Pt educated on falls and encouraged to wear nonskid socks when ambulating in the halls. Pt also encouraged to call for help if feeling weak or dizzy. Pt verbalized understanding of all education provided. Pt is now resting in bed with eyes closed, with no signs or symptoms of pain or distress noted. Pt continues to remain safe on the unit and is observed by rounding every 15 min. RN will continue to monitor.

## 2018-03-25 DIAGNOSIS — F332 Major depressive disorder, recurrent severe without psychotic features: Principal | ICD-10-CM

## 2018-03-25 MED ORDER — AMPHETAMINE-DEXTROAMPHET ER 20 MG PO CP24: 20.0000 mg | ORAL_CAPSULE | Freq: Every day | ORAL | 0 refills | Status: DC

## 2018-03-25 MED ORDER — ENSURE ENLIVE PO LIQD: 237.0000 mL | Freq: Two times a day (BID) | ORAL | 12 refills | Status: DC

## 2018-03-25 MED ORDER — PRENATAL MULTIVITAMIN CH: 1.0000 | ORAL_TABLET | Freq: Every day | ORAL | 0 refills | Status: DC

## 2018-03-25 MED ORDER — NICOTINE 21 MG/24HR TD PT24: 21.0000 mg | MEDICATED_PATCH | Freq: Every day | TRANSDERMAL | 0 refills | Status: DC

## 2018-03-25 MED ORDER — PREGABALIN 50 MG PO CAPS: 50.0000 mg | ORAL_CAPSULE | Freq: Three times a day (TID) | ORAL | 0 refills | Status: DC

## 2018-03-25 MED ORDER — TEMAZEPAM 30 MG PO CAPS: 30.0000 mg | ORAL_CAPSULE | Freq: Every day | ORAL | 0 refills | Status: DC

## 2018-03-25 MED ORDER — VORTIOXETINE HBR 10 MG PO TABS: 10.0000 mg | ORAL_TABLET | Freq: Every day | ORAL | 0 refills | Status: DC

## 2018-03-25 MED ORDER — HYDROXYZINE HCL 50 MG PO TABS: 50.0000 mg | ORAL_TABLET | Freq: Three times a day (TID) | ORAL | 0 refills | Status: DC | PRN

## 2018-03-25 NOTE — [facility] Suicide Risk Assessment (Signed)
[facility] Discharge Suicide Risk Assessment   Principal Problem: <principal problem not specified> Discharge Diagnoses: Active Problems:   MDD (major depressive disorder)   MDD (major depressive disorder), recurrent episode, severe (HCC)   Total Time spent with patient: 20 minutes  Musculoskeletal: Strength & Muscle Tone: within normal limits Gait & Station: normal Patient leans: N/A  Psychiatric Specialty Exam: Review of Systems  All other systems reviewed and are negative.   Blood pressure 121/90, pulse 95, temperature (!) 97.3 F (36.3 C), temperature source Oral, resp. rate 16, height 5\' 11"  (1.803 m), weight 76.7 kg.Body mass index is 23.57 kg/m.  General Appearance: Casual  Eye Contact::  Good  Speech:  Normal Rate409  Volume:  Normal  Mood:  Anxious  Affect:  Congruent  Thought Process:  Coherent and Descriptions of Associations: Intact  Orientation:  Full (Time, Place, and Person)  Thought Content:  Logical  Suicidal Thoughts:  No  Homicidal Thoughts:  No  Memory:  Immediate;   Fair Recent;   Fair Remote;   Fair  Judgement:  Intact  Insight:  Fair  Psychomotor Activity:  Normal  Concentration:  Fair  Recall:  [employer] of Knowledge:Fair  Language: Good  Akathisia:  Negative  Handed:  Right  AIMS (if indicated):     Assets:  Communication Skills Desire for Improvement Financial Resources/Insurance Housing Physical Health Resilience  Sleep:  Number of Hours: 6.75  Cognition: WNL  ADL's:  Intact   Mental Status Per Nursing Assessment::   On Admission:  Suicidal ideation indicated by patient, Suicide plan, Plan includes specific time, place, or method, Intention to act on suicide plan, Belief that plan would result in death  Demographic Factors:  Male  Loss Factors: NA  Historical Factors: Impulsivity  Risk Reduction Factors:   Employed, Living with another person, especially a relative and Positive social support  Continued Clinical Symptoms:   Depression:   Impulsivity  Cognitive Features That Contribute To Risk:  None    Suicide Risk:  Minimal: No identifiable suicidal ideation.  Patients presenting with no risk factors but with morbid ruminations; may be classified as minimal risk based on the severity of the depressive symptoms  Follow-up Information    [facility] Go on 03/27/2018.   Why:  Your medication management appointment is Thursday, 03/27/18 at 9:30a.  Your therapy appointment is the same day at 10:30a.  Please bring: photo ID, proof of insurance, social security card, and discharge paperwork.  Contact information: [address] [place], [place] [address] Phone: [phone] [phone]          Plan Of Care/Follow-up recommendations:  Activity:  ad lib  Antonieta Pert, MD 03/25/2018, 7:48 AM
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
## 2018-03-25 NOTE — Progress Notes (Signed)
Patient ID: Bob Solomon, male   DOB: 06/05/1985, 32 y.o.   MRN: [id number]   D: Patient pleasant and joking with staff tonight. Playing a game in dayroom and then went to group. He reports mood better tonight. Talked about his problems sleeping. Sleep medication scheduled. No active SI tonight and contracts on the unit. A: Staff will continues to monitor on q 15 minute checks, follow treatment plan, and give meds as ordered. R: Cooperative on the unit

## 2018-03-25 NOTE — Progress Notes (Signed)
Pt discharged to lobby. Pt was stable and appreciative at that time. All papers and prescriptions were given and valuables returned. Verbal understanding expressed. Denies SI/HI and A/VH. Pt given opportunity to express concerns and ask questions.  Suicide prevention pamphlet discussed and given to patient upon discharge.  

## 2018-03-25 NOTE — Progress Notes (Signed)
  [facility] Adult Case Management Discharge Plan :  Will you be returning to the same living situation after discharge:  No. Patient reports he is discharging home with an friend.  At discharge, do you have transportation home?: Yes,  bus passes Do you have the ability to pay for your medications: Yes,  income from employment, Medicaid   Release of information consent forms completed and in the chart;  Patient's signature needed at discharge.  Patient to Follow up at: Follow-up Information    [facility] Go on 03/27/2018.   Why:  Your medication management appointment is Thursday, 03/27/18 at 9:30a.  Your therapy appointment is the same day at 10:30a.  Please bring: photo ID, proof of insurance, social security card, and discharge paperwork.  Contact information: [address] [place], [place] [address] Phone: [phone] Fax:([phone]          Next level of care provider has access to [facility] Link:yes  Safety Planning and Suicide Prevention discussed: Yes,  with the patient's ex wife  Have you used any form of tobacco in the last 30 days? (Cigarettes, Smokeless Tobacco, Cigars, and/or Pipes): Yes  Has patient been referred to the Quitline?: Patient refused referral  Patient has been referred for addiction treatment: N/A  [name], LCSWA 03/25/2018, 3:42 PM

## 2018-04-15 ENCOUNTER — Emergency Department (HOSPITAL_COMMUNITY)
Admission: EM | Admit: 2018-04-15 | Discharge: 2018-04-15 | Disposition: A | Discharge status: Home / Self Care | Payer: Medicaid Other | Attending: Emergency Medicine | Admitting: Emergency Medicine

## 2018-04-15 ENCOUNTER — Encounter (HOSPITAL_COMMUNITY): Payer: Self-pay | Admitting: Emergency Medicine

## 2018-04-15 ENCOUNTER — Other Ambulatory Visit: Payer: Self-pay

## 2018-04-15 ENCOUNTER — Emergency Department (HOSPITAL_COMMUNITY): Payer: Medicaid Other

## 2018-04-15 DIAGNOSIS — Z590 Homelessness: Secondary | ICD-10-CM | POA: Diagnosis not present

## 2018-04-15 DIAGNOSIS — B353 Tinea pedis: Secondary | ICD-10-CM | POA: Diagnosis not present

## 2018-04-15 DIAGNOSIS — F17210 Nicotine dependence, cigarettes, uncomplicated: Secondary | ICD-10-CM | POA: Diagnosis not present

## 2018-04-15 DIAGNOSIS — M25472 Effusion, left ankle: Secondary | ICD-10-CM | POA: Diagnosis not present

## 2018-04-15 DIAGNOSIS — Z79899 Other long term (current) drug therapy: Secondary | ICD-10-CM | POA: Insufficient documentation

## 2018-04-15 DIAGNOSIS — M25473 Effusion, unspecified ankle: Secondary | ICD-10-CM

## 2018-04-15 DIAGNOSIS — R600 Localized edema: Secondary | ICD-10-CM | POA: Diagnosis present

## 2018-04-15 DIAGNOSIS — G35 Multiple sclerosis: Secondary | ICD-10-CM | POA: Diagnosis not present

## 2018-04-15 MED ORDER — CEPHALEXIN 500 MG PO CAPS: 500.0000 mg | ORAL_CAPSULE | Freq: Four times a day (QID) | ORAL | 0 refills | Status: DC

## 2018-04-15 NOTE — ED Triage Notes (Addendum)
Pt arrives via POV from home with bilateral ankle swelling, malodorous feet bilaterally. Distal circulation and sensation intact. VSS.

## 2018-04-15 NOTE — Progress Notes (Signed)
CSW consulted for homeless issues .CSW spoke with pt at bedside and was informed that pt has been staying place to place with no set address at this time. Pt expressed a desire to shelter resources as well as other housing resources. CSW expressed that he is looking for permanent housing soon. CSW advised pt that he could follow up with DSS for further resources on what pt would need to do in order to get assistance with that.    Pt expressed no further concerns to CSW. CSW will sign off.     Claude Manges. Anetha Slagel, MSW, LCSW-A Emergency Department Clinical Social Worker [phone]

## 2018-04-15 NOTE — Discharge Instructions (Addendum)
Start taking lamisil, available over the counter according to label instructions.

## 2018-04-15 NOTE — ED Notes (Signed)
Patient verbalizes understanding of discharge instructions. Opportunity for questioning and answers were provided. Armband removed by staff, pt discharged from ED ambulatory.   

## 2018-04-15 NOTE — ED Provider Notes (Signed)
[facility] EMERGENCY DEPARTMENT Provider Note   CSN: [id number] Arrival date & time: 04/15/18  1220     History   Chief Complaint Chief Complaint  Patient presents with  . Ankle Pain    HPI Bob Solomon is a 33 y.o. male.  The history is provided by the patient. No language interpreter was used.  Ankle Pain   Bob Solomon is a 33 y.o. male who presents to the Emergency Department complaining of ankle pain. He has a history of multiple sclerosis here for evaluation of atraumatic left ankle pain and swelling. This morning he noticed that his ankle was swollen and painful on the sides. He reports no known injuries. 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 He also is currently homeless and sometimes sleeps at the bus stop. He is unsure if he has frozen his feet are not. He does have pain on ambulation. His feet were hurting very bad a few days ago, now better. He denies any fevers, nausea, vomiting, diarrhea. Past Medical History:  Diagnosis Date  . MS (multiple sclerosis) [facility])     Patient Active Problem List   Diagnosis Date Noted  . MDD (major depressive disorder) 03/20/2018  . MDD (major depressive disorder), recurrent episode, severe (HCC) 03/20/2018  . Multiple sclerosis exacerbation (HCC) 02/10/2018    Past Surgical History:  Procedure Laterality Date  . KNEE SURGERY          Home Medications    Prior to Admission medications   Medication Sig Start Date End Date Taking? Authorizing Provider  ADDERALL XR 30 MG 24 hr capsule Take 30 mg by mouth daily. 01/10/18   [provider]  cephALEXin (KEFLEX) 500 MG capsule Take 1 capsule (500 mg total) by mouth 4 (four) times daily. 04/15/18   Tilden Fossa, MD  feeding supplement, ENSURE ENLIVE, (ENSURE ENLIVE) LIQD Take 237 mLs by mouth 2 (two) times daily between meals. (May buy over the counter) 03/25/18   Aldean Baker, NP  hydrOXYzine (ATARAX/VISTARIL) 50 MG  tablet Take 1 tablet (50 mg total) by mouth 3 (three) times daily as needed for anxiety. 03/25/18   Aldean Baker, NP  nicotine (NICODERM CQ - DOSED IN MG/24 HOURS) 21 mg/24hr patch Place 1 patch (21 mg total) onto the skin daily. (May buy over the counter) 03/26/18   Aldean Baker, NP  pregabalin (LYRICA) 75 MG capsule Take 1 capsule (75 mg total) by mouth 2 (two) times daily. 02/14/18   Kathlen Mody, MD  Prenatal Vit-Fe Fumarate-FA (PRENATAL MULTIVITAMIN) TABS tablet Take 1 tablet by mouth daily at 12 noon. (May buy over the counter) 03/25/18   Aldean Baker, NP  temazepam (RESTORIL) 30 MG capsule Take 1 capsule (30 mg total) by mouth at bedtime. For sleep 03/25/18   Aldean Baker, NP  vortioxetine HBr (TRINTELLIX) 10 MG TABS tablet Take 1 tablet (10 mg total) by mouth daily. For mood 03/26/18   Aldean Baker, NP    Family History Family History  Family history unknown: Yes    Social History Social History   Tobacco Use  . Smoking status: Current Every Day Smoker    Packs/day: 0.50    Years: 15.00    Pack years: 7.50    Types: Cigarettes  . Smokeless tobacco: Never Used  . Tobacco comment: "about 3 per day" 02/17/2018  Substance Use Topics  . Alcohol use: Yes    Comment: Social  .  Drug use: Yes    Types: Marijuana     Allergies   Shellfish allergy and Penicillins   Review of Systems Review of Systems  All other systems reviewed and are negative.    Physical Exam Updated Vital Signs BP (!) 140/94 (BP Location: Right Arm)   Pulse 81   Temp 97.8 F (36.6 C) (Oral)   Resp 17   Ht 6\' 2"  (1.88 m)   Wt 81.6 kg   SpO2 100%   BMI 23.11 kg/m   Physical Exam Vitals signs and nursing note reviewed.  Constitutional:      Appearance: Normal appearance.  HENT:     Head: Normocephalic and atraumatic.  Neck:     Musculoskeletal: Normal range of motion and neck supple.  Cardiovascular:     Rate and Rhythm: Normal rate and regular rhythm.  Pulmonary:     Effort:  Pulmonary effort is normal. No respiratory distress.  Musculoskeletal: Normal range of motion.     Comments: 2+ DP pulses bilaterally. There is moderate edema to the left ankle with tenderness to palpation over bilateral malleolus. There is no significant erythema to the ankle. There is mild generalized edema to bilateral feet. There is mild violations changes to the toes, greatest over the great toes. There are large intact vesicles to the base of the toes as well as the left heel.  Skin:    General: Skin is warm and dry.     Capillary Refill: Capillary refill takes less than 2 seconds.  Neurological:     Mental Status: He is alert and oriented to person, place, and time.  Psychiatric:        Mood and Affect: Mood normal.        Behavior: Behavior normal.      ED Treatments / Results  Labs (all labs ordered are listed, but only abnormal results are displayed) Labs Reviewed - No data to display  EKG None  Radiology Dg Ankle Complete Left  Result Date: 04/15/2018 CLINICAL DATA:  Pain and swelling of the left ankle. EXAM: LEFT ANKLE COMPLETE - 3+ VIEW COMPARISON:  None. FINDINGS: There is no evidence of fracture, dislocation, or joint effusion. Soft tissue swelling around the ankle is identified. Soft tissues are unremarkable. IMPRESSION: No acute fracture or dislocation. Soft tissue swelling around the left ankle. Electronically Signed   By: Sherian Rein M.D.   On: 04/15/2018 13:43   Dg Foot Complete Left  Result Date: 04/15/2018 CLINICAL DATA:  Pain and swelling of the left foot. EXAM: LEFT FOOT - COMPLETE 3+ VIEW COMPARISON:  None. FINDINGS: There is no evidence of fracture or dislocation. There is no evidence of arthropathy or other focal bone abnormality. Soft tissues are unremarkable. IMPRESSION: Negative. Electronically Signed   By: Sherian Rein M.D.   On: 04/15/2018 13:44    Procedures Procedures (including critical care time)  Medications Ordered in ED Medications - No  data to display   Initial Impression / Assessment and Plan / ED Course  I have reviewed the triage vital signs and the nursing notes.  Pertinent labs & imaging results that were available during my care of the patient were reviewed by me and considered in my medical decision making (see chart for details).     Patient here for evaluation of the ankle swelling. He has a blistering to bilateral feet with distal violations and erythematous changes concerning for resolving frostbite versus developing cellulitis. Question of coexistence fungal infection. Presentation is not consistent with  septic arthritis, gouty arthritis, DVT, CHF. Will treat with antibiotics as well as antifungals. Counseled patient on local foot care, outpatient follow-up and return precautions.  Final Clinical Impressions(s) / ED Diagnoses   Final diagnoses:  Ankle edema  Tinea pedis of both feet    ED Discharge Orders         Ordered    cephALEXin (KEFLEX) 500 MG capsule  4 times daily     04/15/18 1408           Tilden Fossa, MD 04/15/18 1410

## 2018-04-17 ENCOUNTER — Encounter (HOSPITAL_COMMUNITY): Payer: Self-pay | Admitting: *Deleted

## 2018-04-17 ENCOUNTER — Other Ambulatory Visit: Payer: Self-pay

## 2018-04-17 ENCOUNTER — Emergency Department (HOSPITAL_COMMUNITY)
Admission: EM | Admit: 2018-04-17 | Discharge: 2018-04-18 | Disposition: A | Discharge status: Home / Self Care | Payer: Medicaid Other | Attending: Emergency Medicine | Admitting: Emergency Medicine

## 2018-04-17 DIAGNOSIS — R45851 Suicidal ideations: Secondary | ICD-10-CM | POA: Insufficient documentation

## 2018-04-17 DIAGNOSIS — F17210 Nicotine dependence, cigarettes, uncomplicated: Secondary | ICD-10-CM | POA: Insufficient documentation

## 2018-04-17 DIAGNOSIS — Z79899 Other long term (current) drug therapy: Secondary | ICD-10-CM | POA: Insufficient documentation

## 2018-04-17 DIAGNOSIS — F332 Major depressive disorder, recurrent severe without psychotic features: Secondary | ICD-10-CM

## 2018-04-17 DIAGNOSIS — G35 Multiple sclerosis: Secondary | ICD-10-CM | POA: Insufficient documentation

## 2018-04-17 DIAGNOSIS — F1020 Alcohol dependence, uncomplicated: Secondary | ICD-10-CM | POA: Insufficient documentation

## 2018-04-17 DIAGNOSIS — F322 Major depressive disorder, single episode, severe without psychotic features: Secondary | ICD-10-CM | POA: Insufficient documentation

## 2018-04-17 LAB — CBC
HCT: 46.2 % (ref 39.0–52.0)
Hemoglobin: 14.7 g/dL (ref 13.0–17.0)
MCH: 30.6 pg (ref 26.0–34.0)
MCHC: 31.8 g/dL (ref 30.0–36.0)
MCV: 96.3 fL (ref 80.0–100.0)
Platelets: 225 10*3/uL (ref 150–400)
RBC: 4.8 MIL/uL (ref 4.22–5.81)
RDW: 14.2 % (ref 11.5–15.5)
WBC: 10.3 10*3/uL (ref 4.0–10.5)
nRBC: 0 % (ref 0.0–0.2)

## 2018-04-17 NOTE — ED Notes (Addendum)
PT asked to change into purple scrubs and belongings collected

## 2018-04-17 NOTE — ED Triage Notes (Signed)
Pt arrives via [employer], voluntary with SI. Plans to run out in traffic.

## 2018-04-18 ENCOUNTER — Inpatient Hospital Stay (HOSPITAL_COMMUNITY)
Admission: AD | Admit: 2018-04-18 | Discharge: 2018-04-22 | DRG: 885 | Disposition: A | Discharge status: Home / Self Care | Payer: Medicaid Other | Source: Intra-hospital | Attending: Psychiatry | Admitting: Psychiatry

## 2018-04-18 ENCOUNTER — Other Ambulatory Visit: Payer: Self-pay

## 2018-04-18 ENCOUNTER — Encounter (HOSPITAL_COMMUNITY): Payer: Self-pay | Admitting: *Deleted

## 2018-04-18 DIAGNOSIS — Z91013 Allergy to seafood: Secondary | ICD-10-CM

## 2018-04-18 DIAGNOSIS — Z590 Homelessness: Secondary | ICD-10-CM | POA: Diagnosis not present

## 2018-04-18 DIAGNOSIS — F333 Major depressive disorder, recurrent, severe with psychotic symptoms: Secondary | ICD-10-CM | POA: Diagnosis present

## 2018-04-18 DIAGNOSIS — G4700 Insomnia, unspecified: Secondary | ICD-10-CM | POA: Diagnosis present

## 2018-04-18 DIAGNOSIS — R45851 Suicidal ideations: Secondary | ICD-10-CM | POA: Diagnosis present

## 2018-04-18 DIAGNOSIS — F17210 Nicotine dependence, cigarettes, uncomplicated: Secondary | ICD-10-CM | POA: Diagnosis present

## 2018-04-18 DIAGNOSIS — Z79899 Other long term (current) drug therapy: Secondary | ICD-10-CM | POA: Diagnosis not present

## 2018-04-18 DIAGNOSIS — Z880 Allergy status to penicillin: Secondary | ICD-10-CM

## 2018-04-18 DIAGNOSIS — G35 Multiple sclerosis: Secondary | ICD-10-CM | POA: Diagnosis present

## 2018-04-18 DIAGNOSIS — R634 Abnormal weight loss: Secondary | ICD-10-CM | POA: Diagnosis present

## 2018-04-18 LAB — COMPREHENSIVE METABOLIC PANEL
ALK PHOS: 51 U/L (ref 38–126)
ALT: 15 U/L (ref 0–44)
AST: 23 U/L (ref 15–41)
Albumin: 3.5 g/dL (ref 3.5–5.0)
Anion gap: 7 (ref 5–15)
BUN: 10 mg/dL (ref 6–20)
CO2: 24 mmol/L (ref 22–32)
CREATININE: 1.01 mg/dL (ref 0.61–1.24)
Calcium: 8.7 mg/dL — ABNORMAL LOW (ref 8.9–10.3)
Chloride: 106 mmol/L (ref 98–111)
GFR calc Af Amer: >60 mL/min (ref >60)
GFR calc non Af Amer: >60 mL/min (ref >60)
Glucose, Bld: 168 mg/dL — ABNORMAL HIGH (ref 70–99)
Potassium: 3.7 mmol/L (ref 3.5–5.1)
Sodium: 137 mmol/L (ref 135–145)
TOTAL PROTEIN: 5.9 g/dL — AB (ref 6.5–8.1)
Total Bilirubin: 0.6 mg/dL (ref 0.3–1.2)

## 2018-04-18 LAB — ACETAMINOPHEN LEVEL: Acetaminophen (Tylenol), Serum: <10 ug/mL — ABNORMAL LOW (ref 10–30)

## 2018-04-18 LAB — RAPID URINE DRUG SCREEN, HOSP PERFORMED
Amphetamines: NOT DETECTED
BARBITURATES: NOT DETECTED
Benzodiazepines: NOT DETECTED
Cocaine: NOT DETECTED
Opiates: NOT DETECTED
Tetrahydrocannabinol: NOT DETECTED

## 2018-04-18 LAB — ETHANOL: Alcohol, Ethyl (B): <10 mg/dL (ref <10)

## 2018-04-18 LAB — SALICYLATE LEVEL: Salicylate Lvl: <7 mg/dL (ref 2.8–30.0)

## 2018-04-18 MED ORDER — MAGNESIUM HYDROXIDE 400 MG/5ML PO SUSP: 30.0000 mL | Freq: Every day | ORAL | Status: DC | PRN

## 2018-04-18 MED ORDER — HYDROXYZINE HCL 25 MG PO TABS: 50.0000 mg | ORAL_TABLET | Freq: Three times a day (TID) | ORAL | Status: DC | PRN

## 2018-04-18 MED ORDER — ALUM & MAG HYDROXIDE-SIMETH 200-200-20 MG/5ML PO SUSP: 30.0000 mL | ORAL | Status: DC | PRN

## 2018-04-18 MED ORDER — ARIPIPRAZOLE 5 MG PO TABS
5.0000 mg | ORAL_TABLET | Freq: Every day | ORAL | Status: DC
  Administered 2018-04-18 – 2018-04-22 (×5): 5 mg via ORAL
  Filled 2018-04-18 (×4): qty 1
  Filled 2018-04-18: qty 7
  Filled 2018-04-18 (×2): qty 1

## 2018-04-18 MED ORDER — NICOTINE 21 MG/24HR TD PT24: 21.0000 mg | MEDICATED_PATCH | Freq: Every day | TRANSDERMAL | Status: DC

## 2018-04-18 MED ORDER — ARIPIPRAZOLE 2 MG PO TABS
2.0000 mg | ORAL_TABLET | Freq: Every day | ORAL | Status: DC
  Filled 2018-04-18 (×2): qty 1

## 2018-04-18 MED ORDER — PREGABALIN 75 MG PO CAPS
75.0000 mg | ORAL_CAPSULE | Freq: Two times a day (BID) | ORAL | Status: DC
  Administered 2018-04-18 – 2018-04-22 (×8): 75 mg via ORAL
  Filled 2018-04-18 (×8): qty 1

## 2018-04-18 MED ORDER — TRAZODONE HCL 50 MG PO TABS
50.0000 mg | ORAL_TABLET | Freq: Every evening | ORAL | Status: DC | PRN
  Administered 2018-04-18 – 2018-04-21 (×4): 50 mg via ORAL
  Filled 2018-04-18 (×2): qty 7
  Filled 2018-04-18 (×4): qty 1

## 2018-04-18 MED ORDER — NICOTINE 21 MG/24HR TD PT24
21.0000 mg | MEDICATED_PATCH | Freq: Every day | TRANSDERMAL | Status: DC
  Administered 2018-04-18 – 2018-04-22 (×5): 21 mg via TRANSDERMAL
  Filled 2018-04-18 (×10): qty 1

## 2018-04-18 MED ORDER — TRAZODONE HCL 50 MG PO TABS
50.0000 mg | ORAL_TABLET | Freq: Every evening | ORAL | Status: DC | PRN
  Filled 2018-04-18 (×2): qty 1

## 2018-04-18 MED ORDER — DULOXETINE HCL 20 MG PO CPEP
20.0000 mg | ORAL_CAPSULE | Freq: Every day | ORAL | Status: DC
  Filled 2018-04-18 (×2): qty 1

## 2018-04-18 MED ORDER — HYDROXYZINE HCL 25 MG PO TABS
25.0000 mg | ORAL_TABLET | Freq: Four times a day (QID) | ORAL | Status: DC | PRN
  Administered 2018-04-18 – 2018-04-21 (×4): 25 mg via ORAL
  Filled 2018-04-18: qty 1
  Filled 2018-04-18: qty 10
  Filled 2018-04-18 (×3): qty 1

## 2018-04-18 MED ORDER — DULOXETINE HCL 20 MG PO CPEP
20.0000 mg | ORAL_CAPSULE | Freq: Every day | ORAL | Status: DC
  Administered 2018-04-18 – 2018-04-20 (×3): 20 mg via ORAL
  Filled 2018-04-18 (×5): qty 1

## 2018-04-18 MED ORDER — VORTIOXETINE HBR 10 MG PO TABS
10.0000 mg | ORAL_TABLET | Freq: Every day | ORAL | Status: DC
  Filled 2018-04-18: qty 1

## 2018-04-18 MED ORDER — TEMAZEPAM 15 MG PO CAPS
30.0000 mg | ORAL_CAPSULE | Freq: Every day | ORAL | Status: DC
  Administered 2018-04-18: 30 mg via ORAL
  Filled 2018-04-18 (×2): qty 2

## 2018-04-18 MED ORDER — PREGABALIN 25 MG PO CAPS
75.0000 mg | ORAL_CAPSULE | Freq: Two times a day (BID) | ORAL | Status: DC
  Administered 2018-04-18: 75 mg via ORAL
  Filled 2018-04-18: qty 3

## 2018-04-18 MED ORDER — ACETAMINOPHEN 325 MG PO TABS
650.0000 mg | ORAL_TABLET | Freq: Four times a day (QID) | ORAL | Status: DC | PRN
  Administered 2018-04-18: 650 mg via ORAL
  Filled 2018-04-18: qty 2

## 2018-04-18 MED ORDER — AMPHETAMINE-DEXTROAMPHET ER 30 MG PO CP24: 30.0000 mg | ORAL_CAPSULE | Freq: Every day | ORAL | Status: DC

## 2018-04-18 NOTE — ED Provider Notes (Signed)
Patient has been accepted at [facility] by Dr. Jama Flavors.   Dione Booze, MD 04/18/18 [phone]
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
## 2018-04-18 NOTE — Progress Notes (Signed)
Recreation Therapy Notes  Date:  1.24.20 Time: 0930 Location: [address] Dayroom  Group Topic: Stress Management  Goal Area(s) Addresses:  Patient will identify positive stress management techniques. Patient will identify benefits of using stress management post d/c.  Intervention: Stress Management  Activity :  Meditation.  LRT played a meditation that dealt with having choice.  Patients were to listen to the meditation to follow along and engage.  Education:  Stress Management, Discharge Planning.   Education Outcome: Acknowledges Education  Clinical Observations/Feedback:  Pt did not attend group.     Caroll Rancher, LRT/CTRS         Lillia Abed, [place] Norberto A 04/18/2018 11:05 AM

## 2018-04-18 NOTE — Progress Notes (Signed)
Tresa Endo, [place] and Dr. Preston Fleeting informed of pt disposition.

## 2018-04-18 NOTE — Progress Notes (Signed)
D: Pt was in bed in his room upon initial approach.  Pt presents with depressed affect and mood.  He describes his day as "okay."  His goal is "getting out of here."  Pt reports he feels safe to discharge.  Pt denies SI/HI, denies hallucinations, reports generalized pain of 8/10.  Pt was more visible in milieu later in the evening.  He interacts with others cautiously.    A: Introduced self to pt.  Met with pt 1:1.  Actively listened to pt and offered support and encouragement.  PRN medication administered for anxiety, sleep, and pain.  Q15 minute safety checks maintained.  Cold packs provided for pain.    R: Pt is safe on the unit.  Pt is compliant with medications.  Pt verbally contracts for safety.  Will continue to monitor and assess.

## 2018-04-18 NOTE — ED Notes (Signed)
TTS in progress 

## 2018-04-18 NOTE — [facility] Group Notes (Signed)
LCSW Group Therapy Note  04/18/2018 1:15pm  Type of Therapy and Topic:  Group Therapy: Avoiding Self-Sabotaging and Enabling Behaviors  Participation Level:  Did Not Attend   Description of Group:   In this group, patients will learn how to identify obstacles, self-sabotaging and enabling behaviors, as well as: what are they, why do we do them and what needs these behaviors meet. Discuss unhealthy relationships and how to have positive healthy boundaries with those that sabotage and enable. Explore aspects of self-sabotage and enabling in yourself and how to limit these self-destructive behaviors in everyday life.   Therapeutic Goals: 1. Patient will identify one obstacle that relates to self-sabotage and enabling behaviors 2. Patient will identify one personal self-sabotaging or enabling behavior they did prior to admission 3. Patient will state a plan to change the above identified behavior 4. Patient will demonstrate ability to communicate their needs through discussion and/or role play.   Marian Sorrow, MSW Intern CSW Department 04/18/2018 2:30 PM 2
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
## 2018-04-18 NOTE — Tx Team (Signed)
Interdisciplinary Treatment and Diagnostic Plan Update  04/18/2018 Time of Session:  Bob Solomon MRN: [id number]  Principal Diagnosis: <principal problem not specified>  Secondary Diagnoses: Active Problems:   MDD (major depressive disorder), recurrent episode, severe (HCC)   Current Medications:  Current Facility-Administered Medications  Medication Dose Route Frequency Provider Last Rate Last Dose  . acetaminophen (TYLENOL) tablet 650 mg  650 mg Oral Q6H PRN Laverle Hobby, PA-C      . alum & mag hydroxide-simeth (MAALOX/MYLANTA) 200-200-20 MG/5ML suspension 30 mL  30 mL Oral Q4H PRN Laverle Hobby, PA-C      . hydrOXYzine (ATARAX/VISTARIL) tablet 25 mg  25 mg Oral Q6H PRN Patriciaann Clan E, PA-C      . magnesium hydroxide (MILK OF MAGNESIA) suspension 30 mL  30 mL Oral Daily PRN Patriciaann Clan E, PA-C      . nicotine (NICODERM CQ - dosed in mg/24 hours) patch 21 mg  21 mg Transdermal Daily Cobos, Myer Peer, MD      . traZODone (DESYREL) tablet 50 mg  50 mg Oral QHS,MR X 1 Simon, Spencer E, PA-C       PTA Medications: Medications Prior to Admission  Medication Sig Dispense Refill Last Dose  . ADDERALL XR 30 MG 24 hr capsule Take 30 mg by mouth daily.  0 Past Month at Unknown time  . cephALEXin (KEFLEX) 500 MG capsule Take 1 capsule (500 mg total) by mouth 4 (four) times daily. 28 capsule 0   . feeding supplement, ENSURE ENLIVE, (ENSURE ENLIVE) LIQD Take 237 mLs by mouth 2 (two) times daily between meals. (May buy over the counter) 237 mL 12   . hydrOXYzine (ATARAX/VISTARIL) 50 MG tablet Take 1 tablet (50 mg total) by mouth 3 (three) times daily as needed for anxiety. 30 tablet 0   . nicotine (NICODERM CQ - DOSED IN MG/24 HOURS) 21 mg/24hr patch Place 1 patch (21 mg total) onto the skin daily. (May buy over the counter) 1 patch 0   . pregabalin (LYRICA) 75 MG capsule Take 1 capsule (75 mg total) by mouth 2 (two) times daily. 60 capsule 0 Past Month at Unknown time  . Prenatal  Vit-Fe Fumarate-FA (PRENATAL MULTIVITAMIN) TABS tablet Take 1 tablet by mouth daily at 12 noon. (May buy over the counter) 1 tablet 0   . temazepam (RESTORIL) 30 MG capsule Take 1 capsule (30 mg total) by mouth at bedtime. For sleep 2 capsule 0   . vortioxetine HBr (TRINTELLIX) 10 MG TABS tablet Take 1 tablet (10 mg total) by mouth daily. For mood 30 tablet 0     Patient Stressors:    Patient Strengths: Average or above average intelligence Communication skills General fund of knowledge Work skills  Treatment Modalities: Medication Management, Group therapy, Case management,  1 to 1 session with clinician, Psychoeducation, Recreational therapy.   Physician Treatment Plan for Primary Diagnosis: <principal problem not specified> Long Term Goal(s):     Short Term Goals:    Medication Management: Evaluate patient's response, side effects, and tolerance of medication regimen.  Therapeutic Interventions: 1 to 1 sessions, Unit Group sessions and Medication administration.  Evaluation of Outcomes: Not Met  Physician Treatment Plan for Secondary Diagnosis: Active Problems:   MDD (major depressive disorder), recurrent episode, severe ([place])  Long Term Goal(s):     Short Term Goals:       Medication Management: Evaluate patient's response, side effects, and tolerance of medication regimen.  Therapeutic Interventions: 1 to 1 sessions, Unit  Group sessions and Medication administration.  Evaluation of Outcomes: Not Met   RN Treatment Plan for Primary Diagnosis: <principal problem not specified> Long Term Goal(s): Knowledge of disease and therapeutic regimen to maintain health will improve  Short Term Goals: Ability to participate in decision making will improve, Ability to verbalize feelings will improve, Ability to disclose and discuss suicidal ideas, Ability to identify and develop effective coping behaviors will improve and Compliance with prescribed medications will  improve  Medication Management: RN will administer medications as ordered by provider, will assess and evaluate patient's response and provide education to patient for prescribed medication. RN will report any adverse and/or side effects to prescribing provider.  Therapeutic Interventions: 1 on 1 counseling sessions, Psychoeducation, Medication administration, Evaluate responses to treatment, Monitor vital signs and CBGs as ordered, Perform/monitor CIWA, COWS, AIMS and Fall Risk screenings as ordered, Perform wound care treatments as ordered.  Evaluation of Outcomes: Not Met   LCSW Treatment Plan for Primary Diagnosis: <principal problem not specified> Long Term Goal(s): Safe transition to appropriate next level of care at discharge, Engage patient in therapeutic group addressing interpersonal concerns.  Short Term Goals: Engage patient in aftercare planning with referrals and resources  Therapeutic Interventions: Assess for all discharge needs, 1 to 1 time with Social worker, Explore available resources and support systems, Assess for adequacy in community support network, Educate family and significant other(s) on suicide prevention, Complete Psychosocial Assessment, Interpersonal group therapy.  Evaluation of Outcomes: Not Met   Progress in Treatment: Attending groups: No. Participating in groups: No. Taking medication as prescribed: Yes. Toleration medication: Yes. Family/Significant other contact made: No, will contact:  if patient consents to collateral contacts Patient understands diagnosis: Yes. Discussing patient identified problems/goals with staff: Yes. Medical problems stabilized or resolved: Yes. Denies suicidal/homicidal ideation: No. Issues/concerns per patient self-inventory: Yes. Other:   New problem(s) identified: None   New Short Term/Long Term Goal(s): medication stabilization, elimination of SI thoughts, development of comprehensive mental wellness plan.     Patient Goals:  I think I need my meds adjust and I want to strengthen my coping skills.    Discharge Plan or Barriers: CSW will continue to follow and assess for an appropriate discharge plan.   Reason for Continuation of Hospitalization: Anxiety Depression Medication stabilization Suicidal ideation  Estimated Length of Stay: 04/22/2018  Attendees: Patient: 04/18/2018 9:45 AM  Physician: Dr. Neita Garnet, MD 04/18/2018 9:45 AM  Nursing: Patyy. D, RN 04/18/2018 9:45 AM  RN Care Manager: 04/18/2018 9:45 AM  Social Worker: Radonna Ricker, [place] 04/18/2018 9:45 AM  Recreational Therapist:  04/18/2018 9:45 AM  Other: Mable Paris, NP  04/18/2018 9:45 AM  Other:  04/18/2018 9:45 AM  Other: 04/18/2018 9:45 AM    Scribe for Treatment Team: Marylee Floras, [place] 04/18/2018 9:45 AM
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
## 2018-04-18 NOTE — ED Provider Notes (Signed)
[facility] EMERGENCY DEPARTMENT Provider Note   CSN: [id number] Arrival date & time: 04/17/18  2300     History   [facility] Chief Complaint  Patient presents with  . Suicidal    HPI Bob Solomon is a 33 y.o. male.   33 year old male with history of multiple sclerosis and major depressive disorder presents to the emergency department for psychiatric evaluation.  He states that he feels as though he left [facility] too soon.  He complains of worsening anxiety, racing thoughts.  Was having thoughts of suicide tonight with plan to run [place] traffic on the freeway.  Called police who transported patient to the ED.  He states he has been compliant with his psychiatric medications.  Feels that they are not helping him.  He has not had any homicidal ideations.  No history of drug use.  Did drink 1 beer earlier today.  The history is provided by the patient. No language interpreter was used.    Past Medical History:  Diagnosis Date  . MS (multiple sclerosis) [facility](HCC)     Patient Active Problem List   Diagnosis Date Noted  . MDD (major depressive disorder) 03/20/2018  . MDD (major depressive disorder), recurrent episode, severe (HCC) 03/20/2018  . Multiple sclerosis exacerbation (HCC) 02/10/2018    Past Surgical History:  Procedure Laterality Date  . KNEE SURGERY          Home Medications    Prior to Admission medications   Medication Sig Start Date End Date Taking? Authorizing Provider  ADDERALL XR 30 MG 24 hr capsule Take 30 mg by mouth daily. 01/10/18   [provider]  cephALEXin (KEFLEX) 500 MG capsule Take 1 capsule (500 mg total) by mouth 4 (four) times daily. 04/15/18   Tilden Fossaees, Elizabeth, MD  feeding supplement, ENSURE ENLIVE, (ENSURE ENLIVE) LIQD Take 237 mLs by mouth 2 (two) times daily between meals. (May buy over the counter) 03/25/18   Aldean BakerSykes, Janet E, NP  hydrOXYzine (ATARAX/VISTARIL) 50 MG tablet Take 1 tablet (50 mg total) by mouth 3  (three) times daily as needed for anxiety. 03/25/18   Aldean BakerSykes, Janet E, NP  nicotine (NICODERM CQ - DOSED IN MG/24 HOURS) 21 mg/24hr patch Place 1 patch (21 mg total) onto the skin daily. (May buy over the counter) 03/26/18   Aldean BakerSykes, Janet E, NP  pregabalin (LYRICA) 75 MG capsule Take 1 capsule (75 mg total) by mouth 2 (two) times daily. 02/14/18   Kathlen ModyAkula, Vijaya, MD  Prenatal Vit-Fe Fumarate-FA (PRENATAL MULTIVITAMIN) TABS tablet Take 1 tablet by mouth daily at 12 noon. (May buy over the counter) 03/25/18   Aldean BakerSykes, Janet E, NP  temazepam (RESTORIL) 30 MG capsule Take 1 capsule (30 mg total) by mouth at bedtime. For sleep 03/25/18   Aldean BakerSykes, Janet E, NP  vortioxetine HBr (TRINTELLIX) 10 MG TABS tablet Take 1 tablet (10 mg total) by mouth daily. For mood 03/26/18   Aldean BakerSykes, Janet E, NP    Family History Family History  Family history unknown: Yes    Social History Social History   Tobacco Use  . Smoking status: Current Every Day Smoker    Packs/day: 0.50    Years: 15.00    Pack years: 7.50    Types: Cigarettes  . Smokeless tobacco: Never Used  . Tobacco comment: "about 3 per day" 02/17/2018  Substance Use Topics  . Alcohol use: Yes    Comment: Social  . Drug use: Yes    Types: Marijuana  Allergies   Shellfish allergy and Penicillins   Review of Systems Review of Systems Ten systems reviewed and are negative for acute change, except as noted in the HPI.    Physical Exam Updated Vital Signs BP (!) 126/96 (BP Location: Right Arm)   Pulse 83   Temp 97.8 F (36.6 C) (Oral)   Resp 19   SpO2 100%   Physical Exam Vitals signs and nursing note reviewed.  Constitutional:      General: He is not in acute distress.    Appearance: He is well-developed. He is not diaphoretic.  HENT:     Head: Normocephalic and atraumatic.  Eyes:     General: No scleral icterus.    Conjunctiva/sclera: Conjunctivae normal.  Neck:     Musculoskeletal: Normal range of motion.  Pulmonary:     Effort:  Pulmonary effort is normal. No respiratory distress.  Musculoskeletal: Normal range of motion.     Comments: No erythema or swelling in BLE  Skin:    General: Skin is warm and dry.     Coloration: Skin is not pale.     Findings: No erythema or rash.  Neurological:     Mental Status: He is alert and oriented to person, place, and time.  Psychiatric:        Mood and Affect: Mood is depressed (mild).        Behavior: Behavior normal.        Thought Content: Thought content includes suicidal ideation. Thought content does not include homicidal ideation. Thought content includes suicidal (plan to run into traffic) plan.      ED Treatments / Results  Labs (all labs ordered are listed, but only abnormal results are displayed) Labs Reviewed  COMPREHENSIVE METABOLIC PANEL - Abnormal; Notable for the following components:      Result Value   Glucose, Bld 168 (*)    Calcium 8.7 (*)    Total Protein 5.9 (*)    All other components within normal limits  ACETAMINOPHEN LEVEL - Abnormal; Notable for the following components:   Acetaminophen (Tylenol), Serum <10 (*)    All other components within normal limits  ETHANOL  SALICYLATE LEVEL  CBC  RAPID URINE DRUG SCREEN, HOSP PERFORMED    EKG None  Radiology No results found.  Procedures Procedures (including critical care time)  Medications Ordered in ED Medications  amphetamine-dextroamphetamine (ADDERALL XR) 24 hr capsule 30 mg (has no administration in time range)  hydrOXYzine (ATARAX/VISTARIL) tablet 50 mg (has no administration in time range)  nicotine (NICODERM CQ - dosed in mg/24 hours) patch 21 mg (has no administration in time range)  pregabalin (LYRICA) capsule 75 mg (has no administration in time range)  temazepam (RESTORIL) capsule 30 mg (has no administration in time range)  vortioxetine HBr (TRINTELLIX) tablet 10 mg (has no administration in time range)     Initial Impression / Assessment and Plan / ED Course  I  have reviewed the triage vital signs and the nursing notes.  Pertinent labs & imaging results that were available during my care of the patient were reviewed by me and considered in my medical decision making (see chart for details).     The patient has been medically cleared and accepted at [facility] for ongoing psychiatric care. Accepted MD, Dr. Jama Flavors.   Final Clinical Impressions(s) / ED Diagnoses   Final diagnoses:  Severe episode of recurrent major depressive disorder, without psychotic features [facility])    ED Discharge Orders    None  Antony Madura, PA-C 04/18/18 0229    Dione Booze, MD 04/18/18 [phone]

## 2018-04-18 NOTE — Progress Notes (Signed)
D Pt slept in his bed from 0700 until 1500. Each time this Clinical research associate checked on him , he was sleeping soundly , Clinical research associate stood at his bedside and he would not awaken.     A He arose around 1545, he signed his admission consents, completed his daily assessment. ON this he wrote he denied SI today and he rated his depression, hoeplessness and anxiety " 10/8/8", respectively. His affect is bizarre , he is anxious, quiet and sad. HE contracts for safety.     R Safety is in place.

## 2018-04-18 NOTE — Progress Notes (Signed)
Patient admitted vol after receiving medical clearance at [facility] ED. Patient presents with Si to walk into traffic. Here at [facility] 1 month ago and symptoms have not improved. Patient's stressors include poor relationship with ex-wife, limited visitation with children, recent b/u with gf, unstable housing, and MS dx with chronic pain. Patient flat, sad and depressed however patient was given lyrica 75mg  and restoril 30mg  (@0459 ) immediately preceding transfer to [facility]. Patient is unable to keep eyes open, has tremendous difficulty ambulating, unable to complete paperwork. Generalized pain rated at a 10/10.  Patient's skin and clothing searched, belongings secured. Patient assisted throughout admission process as he was quite unsteady.  Level III obs initiated. Oriented to unit and emotional support provided. Reassured of safety. Fall prevention plan reviewed and in place as patient is a high fall risk.  Patient verbalizes understanding of POC, fall prevention plan. Strongly encouraged to remain in bed and use call bell or notify [facility] on rounds if he needs something. Endorsing AH in the form of children's and grandmother's voices, passive SI. Verbal contract in place for safety. Denies VH/HI. Remains safe at this time.

## 2018-04-18 NOTE — [facility] Counselor (Signed)
CSW unable to complete PSA with patient at this time. Patient received sleeping/sedative medications and is asleep in his room. Patient did not respond to his name being called. CSW will continue to follow to complete assessment.   Baldo Daub, MSW, LCSWA Clinical Social Worker [facility]  Phone: [phone]

## 2018-04-19 LAB — LIPID PANEL
Cholesterol: 136 mg/dL (ref 0–200)
HDL: 47 mg/dL (ref >40)
LDL Cholesterol: 53 mg/dL (ref 0–99)
Total CHOL/HDL Ratio: 2.9 RATIO
Triglycerides: 182 mg/dL — ABNORMAL HIGH (ref <150)
VLDL: 36 mg/dL (ref 0–40)

## 2018-04-19 LAB — HEMOGLOBIN A1C
Hgb A1c MFr Bld: 5.4 % (ref 4.8–5.6)
Mean Plasma Glucose: 108.28 mg/dL

## 2018-04-19 MED ORDER — ENSURE ENLIVE PO LIQD
237.0000 mL | Freq: Two times a day (BID) | ORAL | Status: DC
  Administered 2018-04-19 – 2018-04-21 (×5): 237 mL via ORAL

## 2018-04-19 NOTE — Plan of Care (Signed)
  Problem: Activity: Goal: Interest or engagement in activities will improve Outcome: Progressing   Problem: Coping: Goal: Ability to verbalize frustrations and anger appropriately will improve Outcome: Progressing   D: Pt alert and oriented on the unit. Pt engaging with RN staff and other pts. Pt denies SI/HI, A/VH. Pt stated that his goal today is discharging, "going out." "I want to leave so I can go back to work." Pt rated his depression today as a 6 and feelings of hopelessness and anxiety both a 0, on a scale of 0 to 10, with 10 being the worst. Pt participated during groups and unit activities today. Pt is pleasant and cooperative.  A: Education, support and encouragement provided, q15 minute safety checks remain in effect. Medications administered per MD orders.  R: No reactions/side effects to medicine noted. Pt denies any concerns at this time, and verbally contracts for safety. Pt ambulating on the unit with no issues. Pt remains safe on and off the unit.

## 2018-04-19 NOTE — [facility] Group Notes (Signed)
LCSW Group Therapy Note  04/19/2018   10:00-11:00am   Type of Therapy and Topic:  Group Therapy: Anger Cues and Responses  Participation Level:  Active   Description of Group:   In this group, patients learned how to recognize the physical, cognitive, emotional, and behavioral responses they have to anger-provoking situations.  They identified a recent time they became angry and how they reacted.  They analyzed how their reaction was possibly beneficial and how it was possibly unhelpful.  The group discussed a variety of healthier coping skills that could help with such a situation in the future.  Deep breathing was practiced briefly.  Therapeutic Goals: 1. Patients will remember their last incident of anger and how they felt emotionally and physically, what their thoughts were at the time, and how they behaved. 2. Patients will identify how their behavior at that time worked for them, as well as how it worked against them. 3. Patients will explore possible new behaviors to use in future anger situations. 4. Patients will learn that anger itself is normal and cannot be eliminated, and that healthier reactions can assist with resolving conflict rather than worsening situations.  Summary of Patient Progress:  Patient was present and engaged when directly called on. Patient appeared to listen the remainder of group. Patient shared this is his second time here. Patient reported about this time yesterday learning that his girlfriend had cheated and previously being in a marriage of 12 years who also cheated. Patient shared it triggered him and took him back to that so something happened and he ended up here. Patient reported his triggers are cheating and stress. Patient reports struggling with anger. Patient was able to identify his warning signs are his heart racing and his adrenaline. Patient reports his plan to cope is to focus on himself more instead of trying to make everyone else happy.    Therapeutic Modalities:   Cognitive Behavioral Therapy  Shellia Cleverly

## 2018-04-19 NOTE — [facility] Group Notes (Signed)
Adult Psychoeducational Group Note  Date:  04/19/2018 Time:  9:50 PM  Group Topic/Focus:  Wrap-Up Group:   The focus of this group is to help patients review their daily goal of treatment and discuss progress on daily workbooks.  Participation Level:  Active  Participation Quality:  Appropriate and Attentive  Affect:  Appropriate  Cognitive:  Alert and Appropriate  Insight: Appropriate and Good  Engagement in Group:  Engaged  Modes of Intervention:  Discussion and Education  Additional Comments:  Pt attended and participated in wrap up group this evening. Pt rated their day an 8/10, due to them being stressed before being here. Pt goal while they are here is to get back to who they are.   [name] 04/19/2018, 9:50 PM

## 2018-04-19 NOTE — Progress Notes (Addendum)
[facility]  04/19/2018 12:01 PM Bob PolDwayne Heslin  MRN:  [id number] Subjective:  Bob Solomon is a 33 year old male with a history of depression and MS. He presented tot he hospital with suicidal ideation and a plan to walk into traffic. He stated his girlfriend cheated on him and they broke up and he is homeless. 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 He stated he was previously married for 12 years and has 3 children; ages 6513, 3711, & 749. He has a good relationship with his ex-wife and is allowed to see his children. He appears sad with a flat affect slow speech. He appears guarded. He is stating he wants to get back to work tomorrow but he appears to be very despondent. [facility]18 for the same presentation. He stated today that [place] and the hotels are eating up his money so he can't find housing. He stated that a woman from [facility] came to see him at the hospital and is supposed help him find housing. He stated he went to [facility] upon discharge and they did not help him. He is currently taking Abilify, Cymbalta, and Vistaril. He stated he has lost weight recently so Ensure was added to his MAR. He medication side effects. He is participating in the therapeutic milieu and attending group therapies. Will continue to monitor Mr Vella RedheadMotley while hospitalized for medication efficacy and improving depressive symptoms.   Principal Problem: Severe recurrent major depressive disorder with psychotic features (HCC) Diagnosis: Principal Problem:   Severe recurrent major depressive disorder with psychotic features (HCC)  Total Time spent with patient: 30 minutes  Past Psychiatric History: As above  Past Medical History:  Past Medical History:  Diagnosis Date  . MS (multiple sclerosis) (HCC)     Past Surgical History:  Procedure Laterality Date  . KNEE SURGERY     Family History:  Family History   Family history unknown: Yes   Family Psychiatric  History: Pt did not give this information Social History:  Social History   Substance and Sexual Activity  Alcohol Use Yes   Comment: Social     Social History   Substance and Sexual Activity  Drug Use Yes  . Types: Marijuana    Social History   Socioeconomic History  . Marital status: Divorced    Spouse name: Not on file  . Number of children: Not on file  . Years of education: Not on file  . Highest education level: Not on file  Occupational History  . 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 Financial resource strain: Not on file  . Food insecurity:    Worry: Not on file    Inability: Not on file  . Transportation needs:    Medical: Not on file    Non-medical: Not on file  Tobacco Use  . Smoking status: Current Every Day Smoker    Packs/day: 0.50    Years: 15.00    Pack years: 7.50    Types: Cigarettes  . Smokeless tobacco: Never Used  . Tobacco comment: "about 3 per day" 02/17/2018  Substance and Sexual Activity  . Alcohol use: Yes    Comment: Social  . Drug use: Yes    Types: Marijuana  . Sexual activity: Yes    Birth control/protection: Condom  Lifestyle  . Physical activity:    Days per week:  Not on file    Minutes per session: Not on file  . Stress: Not on file  Relationships  . Social connections:    Talks on phone: Not on file    Gets together: Not on file    Attends religious service: Not on file    Active member of club or organization: Not on file    Attends meetings of clubs or organizations: Not on file    Relationship status: Not on file  Other Topics Concern  . Not on file  Social History Narrative  . Not on file   Additional Social History:     Sleep: Fair  Appetite:  Poor  Current Medications: Current Facility-Administered Medications  Medication Dose Route Frequency Provider Last Rate Last Dose  . acetaminophen (TYLENOL) tablet 650 mg  650 mg Oral Q6H PRN Kerry Hough,  PA-C   650 mg at 04/18/18 2006  . alum & mag hydroxide-simeth (MAALOX/MYLANTA) 200-200-20 MG/5ML suspension 30 mL  30 mL Oral Q4H PRN Donell Sievert E, PA-C      . ARIPiprazole (ABILIFY) tablet 5 mg  5 mg Oral Daily Brenn Deziel, Rockey Situ, MD   5 mg at 04/19/18 0941  . DULoxetine (CYMBALTA) DR capsule 20 mg  20 mg Oral Daily Evyn Kooyman, Rockey Situ, MD   20 mg at 04/19/18 0941  . feeding supplement (ENSURE ENLIVE) (ENSURE ENLIVE) liquid 237 mL  237 mL Oral BID BM Laveda Abbe, NP   237 mL at 04/19/18 0943  . hydrOXYzine (ATARAX/VISTARIL) tablet 25 mg  25 mg Oral Q6H PRN Kerry Hough, PA-C   25 mg at 04/18/18 2006  . magnesium hydroxide (MILK OF MAGNESIA) suspension 30 mL  30 mL Oral Daily PRN Donell Sievert E, PA-C      . nicotine (NICODERM CQ - dosed in mg/24 hours) patch 21 mg  21 mg Transdermal Daily Susan Bleich, Rockey Situ, MD   21 mg at 04/19/18 0943  . pregabalin (LYRICA) capsule 75 mg  75 mg Oral BID Myranda Pavone, Rockey Situ, MD   75 mg at 04/19/18 0941  . traZODone (DESYREL) tablet 50 mg  50 mg Oral QHS PRN Champagne Paletta, Rockey Situ, MD   50 mg at 04/18/18 2102    Lab Results:  Results for orders placed or performed during the hospital encounter of 04/18/18 (from the past 48 hour(s))  Lipid panel     Status: Abnormal   Collection Time: 04/19/18  6:42 AM  Result Value Ref Range   Cholesterol 136 0 - 200 mg/dL   Triglycerides 620 (H) <150 mg/dL   HDL 47 >35 mg/dL   Total CHOL/HDL Ratio 2.9 RATIO   VLDL 36 0 - 40 mg/dL   LDL Cholesterol 53 0 - 99 mg/dL    Comment:        Total Cholesterol/HDL:CHD Risk Coronary Heart Disease Risk Table                     Men   Women  1/2 Average Risk   3.4   3.3  Average Risk       5.0   4.4  2 X Average Risk   9.6   7.1  3 X Average Risk  23.4   11.0        Use the calculated Patient Ratio above and the CHD Risk Table to determine the patient's CHD Risk.        ATP III CLASSIFICATION (LDL):  <100     mg/dL  Optimal  100-129  mg/dL   Near or Above                     Optimal  130-159  mg/dL   Borderline  910-289  mg/dL   High  >022     mg/dL   Very High Performed at [facility], [address], [place], [place] [address]     Blood Alcohol level:  Lab Results  Component Value Date   ETH <10 04/17/2018   ETH <10 03/20/2018    Metabolic Disorder Labs: No results found for: HGBA1C, MPG No results found for: PROLACTIN Lab Results  Component Value Date   CHOL 136 04/19/2018   TRIG 182 (H) 04/19/2018   HDL 47 04/19/2018   CHOLHDL 2.9 04/19/2018   VLDL 36 04/19/2018   LDLCALC 53 04/19/2018    Physical Findings: AIMS: Facial and Oral Movements Muscles of Facial Expression: None, normal Lips and Perioral Area: None, normal Jaw: None, normal Tongue: None, normal,Extremity Movements Upper (arms, wrists, hands, fingers): None, normal Lower (legs, knees, ankles, toes): None, normal, Trunk Movements Neck, shoulders, hips: None, normal, Overall Severity Severity of abnormal movements (highest score from questions above): None, normal Incapacitation due to abnormal movements: None, normal Patient's awareness of abnormal movements (rate only patient's report): No Awareness, Dental Status Current problems with teeth and/or dentures?: No Does patient usually wear dentures?: No  CIWA:    COWS:     Musculoskeletal: Strength & Muscle Tone: within normal limits Gait & Station: normal Patient leans: N/A  Psychiatric Specialty Exam: Physical Exam  Constitutional: He is oriented to person, place, and time. He appears well-developed and well-nourished.  HENT:  Head: Normocephalic.  Respiratory: Effort normal.  Musculoskeletal: Normal range of motion.  Neurological: He is alert and oriented to person, place, and time.  Psychiatric: His speech is normal and behavior is normal. Judgment and thought content normal. Cognition and memory are normal. He exhibits a depressed mood.    Review of Systems  Psychiatric/Behavioral:  Positive for depression.  All other systems reviewed and are negative.   Blood pressure 116/89, pulse (!) 108, temperature 98.1 F (36.7 C), temperature source Oral, resp. rate 18, height 6' (1.829 m), weight 81.6 kg.Body mass index is 24.41 kg/m.  General Appearance: Casual  Eye Contact:  Fair  Speech:  Clear and Coherent and Slow  Volume:  Normal  Mood:  Depressed, Dysphoric and Hopeless  Affect:  Congruent, Depressed and Flat  Thought Process:  Coherent, Linear and Descriptions of Associations: Intact  Orientation:  Full (Time, Place, and Person)  Thought Content:  Logical and Rumination  Suicidal Thoughts:  No  Homicidal Thoughts:  No  Memory:  Immediate;   Good Recent;   Good Remote;   Fair  Judgement:  Fair  Insight:  Present  Psychomotor Activity:  Normal  Concentration:  Concentration: Good and Attention Span: Good  Recall:  Good  Fund of Knowledge:  Good  Language:  Good  Akathisia:  No  Handed:  Right  AIMS (if indicated):     Assets:  Architect Resilience Social Support Vocational/Educational  ADL's:  Intact  Cognition:  WNL  Sleep:  Number of Hours: 6.75     Treatment Plan Summary: Daily contact with patient to assess and evaluate symptoms and progress in treatment and Medication management  Participate in therpeutic milieu Attend group therapy Medication management: Continue: -Abilify 5 mg for mood stabilization -Cymbalta 20 mg for depression -  Vistaril 25 mg every 6 hours as needed -Trazodone 50 mg as needed for sleep -Lyrica 75 mg for MS   Laveda Abbe, NP 04/19/2018, 12:01 PM   Agree with NP progress note

## 2018-04-20 MED ORDER — DULOXETINE HCL 20 MG PO CPEP
40.0000 mg | ORAL_CAPSULE | Freq: Every day | ORAL | Status: DC
  Administered 2018-04-21 – 2018-04-22 (×2): 40 mg via ORAL
  Filled 2018-04-20: qty 2
  Filled 2018-04-20: qty 14
  Filled 2018-04-20 (×2): qty 2

## 2018-04-20 NOTE — [facility] Group Notes (Signed)
[facility] Group Notes:  (Nursing)  Date:  04/20/2018  Time: 200 PM Type of Therapy:  Nurse Education  Participation Level:  Active  Participation Quality:  Appropriate  Affect:  Appropriate  Cognitive:  Appropriate  Insight:  Appropriate  Engagement in Group:  Engaged  Modes of Intervention:  Discussion and Education  Summary of Progress/Problems:  Nurse led life skills group/ anger management   Shela Nevin 04/20/2018, 7:55 PM

## 2018-04-20 NOTE — Plan of Care (Signed)
  Problem: Activity: Goal: Interest or engagement in activities will improve Outcome: Progressing   Problem: Coping: Goal: Ability to verbalize frustrations and anger appropriately will improve Outcome: Progressing   D: Pt alert and oriented on the unit. Pt engaging with RN staff and other pts. Pt denies SI/HI, A/VH. Pt's goal for today is "working on being myself again. Think I'm ready to leave." Pt also participated during unit groups and activities. Pt is pleasant and cooperative. A: Education, support and encouragement provided, q15 minute safety checks remain in effect. Medications administered per MD orders. R: No reactions/side effects to medicine noted. Pt denies any concerns at this time, and verbally contracts for safety. Pt ambulating on the unit with no issues. Pt remains safe on and off the unit.

## 2018-04-20 NOTE — [facility] Group Notes (Signed)
[facility] LCSW Group Therapy Note  Date/Time:  04/20/2018 9:00-10:00 or 10:00-11:00AM  Type of Therapy and Topic:  Group Therapy:  Healthy and Unhealthy Supports  Participation Level:  Minimal   Description of Group:  Patients in this group were introduced to the idea of adding a variety of healthy supports to address the various needs in their lives.Patients discussed what additional healthy supports could be helpful in their recovery and wellness after discharge in order to prevent future hospitalizations.   An emphasis was placed on using counselor, doctor, therapy groups, 12-step groups, and problem-specific support groups to expand supports.  They also worked as a group on developing a specific plan for several patients to deal with unhealthy supports through boundary-setting, psychoeducation with loved ones, and even termination of relationships.   Therapeutic Goals:   1)  discuss importance of adding supports to stay well once out of the hospital  2)  compare healthy versus unhealthy supports and identify some examples of each  3)  generate ideas and descriptions of healthy supports that can be added  4)  offer mutual support about how to address unhealthy supports  5)  encourage active participation in and adherence to discharge plan    Summary of Patient Progress:  The patient stated that current healthy supports in his life are the different people he meets through work while current unhealthy supports include child support.  The patient expressed a willingness to add positive people as supports to help in his recovery journey.   Therapeutic Modalities:   Motivational Interviewing Brief Solution-Focused Therapy  Evorn Gong

## 2018-04-20 NOTE — Progress Notes (Signed)
DAR: Pt presented with flat and quite affect. Pt stated he had a good day and feel ready for discharge. Pt stated he ha to go back to work and would like to discharged today. Pt requested for trazodone for sleep which was given as ordered.  Pt's safety ensured with 15 minute and environmental checks. Pt currently denies SI/HI and A/V hallucinations. Pt verbally agrees to seek staff if SI/HI or A/VH occurs and to consult with staff before acting on these thoughts. Will continue POC.

## 2018-04-20 NOTE — [facility] Group Notes (Signed)
Adult Psychoeducational Group Note  Date:  04/20/2018 Time:  9:25 PM  Group Topic/Focus:  Wrap-Up Group:   The focus of this group is to help patients review their daily goal of treatment and discuss progress on daily workbooks.  Participation Level:  Active  Participation Quality:  Appropriate and Attentive  Affect:  Appropriate  Cognitive:  Alert and Appropriate  Insight: Appropriate and Good  Engagement in Group:  Engaged  Modes of Intervention:  Discussion and Education  Additional Comments:  Pt attended and participated in wrap up group this evening. Pt rated their day an 8/10, due to them being discharged tomorrow. Pt completed their goal, which was to talk to their family in [place].   [name] 04/20/2018, 9:25 PM

## 2018-04-20 NOTE — Progress Notes (Signed)
[facility] MD Progress Note  04/20/2018 11:13 AM Chaunce Winkels  MRN:  [id number] Subjective: Patient reports he is feeling better than he did on admission.  Currently does not endorse medication side effects.  Denies suicidal ideations at this time.  Objective: I have reviewed chart notes and have met with patient 33 year old male, divorced, currently homeless, employed.  Presented for worsening depression, neurovegetative symptoms, suicidal thoughts of running into traffic, medical history remarkable for MS.  Today presents with improving mood, states he is feeling less depressed.  He presents more future oriented and optimistic regarding his psychosocial stressors.  States that he is no longer as upset regarding recent break-up with girlfriend, and states "I am not married to her, there are other women in the world, and I need to focus on myself".  He identifies a pattern of spending significant portion of his income on hotel, since he is currently homeless, and then not having enough money to rent a place of his own.  States he is working towards finding an apartment or room he can living.  He has a history of MS, currently in remission.  Reports lower extremity pain has improved since admission.  Does not currently present in any acute distress or discomfort. No disruptive or agitated behaviors on unit.  Principal Problem: Severe recurrent major depressive disorder with psychotic features ([place]) Diagnosis: Principal Problem:   Severe recurrent major depressive disorder with psychotic features ([place])  Total Time spent with patient: 20 minutes  Past Psychiatric History: As above  Past Medical History:  Past Medical History:  Diagnosis Date  . MS (multiple sclerosis) ([place])     Past Surgical History:  Procedure Laterality Date  . KNEE SURGERY     Family History:  Family History  Family history unknown: Yes   Family Psychiatric  History: Pt did not give this information Social History:  Social  History   Substance and Sexual Activity  Alcohol Use Yes   Comment: Social     Social History   Substance and Sexual Activity  Drug Use Yes  . Types: Marijuana    Social History   Socioeconomic History  . Marital status: Divorced    Spouse name: Not on file  . Number of children: Not on file  . Years of education: Not on file  . Highest education level: Not on file  Occupational History  . Occupation: Training and development officer  Social Needs  . Financial resource strain: Not on file  . Food insecurity:    Worry: Not on file    Inability: Not on file  . Transportation needs:    Medical: Not on file    Non-medical: Not on file  Tobacco Use  . Smoking status: Current Every Day Smoker    Packs/day: 0.50    Years: 15.00    Pack years: 7.50    Types: Cigarettes  . Smokeless tobacco: Never Used  . Tobacco comment: "about 3 per day" 02/17/2018  Substance and Sexual Activity  . Alcohol use: Yes    Comment: Social  . Drug use: Yes    Types: Marijuana  . Sexual activity: Yes    Birth control/protection: Condom  Lifestyle  . Physical activity:    Days per week: Not on file    Minutes per session: Not on file  . Stress: Not on file  Relationships  . Social connections:    Talks on phone: Not on file    Gets together: Not on file    Attends religious service:  Not on file    Active member of club or organization: Not on file    Attends meetings of clubs or organizations: Not on file    Relationship status: Not on file  Other Topics Concern  . Not on file  Social History Narrative  . Not on file   Additional Social History:     Sleep: Improving  Appetite:  Improving  Current Medications: Current Facility-Administered Medications  Medication Dose Route Frequency Provider Last Rate Last Dose  . acetaminophen (TYLENOL) tablet 650 mg  650 mg Oral Q6H PRN Laverle Hobby, PA-C   650 mg at 04/18/18 2006  . alum & mag hydroxide-simeth (MAALOX/MYLANTA) 200-200-20 MG/5ML suspension 30 mL   30 mL Oral Q4H PRN Patriciaann Clan E, PA-C      . ARIPiprazole (ABILIFY) tablet 5 mg  5 mg Oral Daily Farin Buhman, Myer Peer, MD   5 mg at 04/20/18 0751  . DULoxetine (CYMBALTA) DR capsule 20 mg  20 mg Oral Daily Ilsa Bonello, Myer Peer, MD   20 mg at 04/20/18 0751  . feeding supplement (ENSURE ENLIVE) (ENSURE ENLIVE) liquid 237 mL  237 mL Oral BID BM Ethelene Hal, NP   237 mL at 04/20/18 0946  . hydrOXYzine (ATARAX/VISTARIL) tablet 25 mg  25 mg Oral Q6H PRN Laverle Hobby, PA-C   25 mg at 04/18/18 2006  . magnesium hydroxide (MILK OF MAGNESIA) suspension 30 mL  30 mL Oral Daily PRN Patriciaann Clan E, PA-C      . nicotine (NICODERM CQ - dosed in mg/24 hours) patch 21 mg  21 mg Transdermal Daily Shaylynne Lunt, Myer Peer, MD   21 mg at 04/20/18 0751  . pregabalin (LYRICA) capsule 75 mg  75 mg Oral BID Rhylynn Perdomo, Myer Peer, MD   75 mg at 04/20/18 0751  . traZODone (DESYREL) tablet 50 mg  50 mg Oral QHS PRN Nijah Orlich, Myer Peer, MD   50 mg at 04/19/18 2159    Lab Results:  Results for orders placed or performed during the hospital encounter of 04/18/18 (from the past 48 hour(s))  Hemoglobin A1c     Status: None   Collection Time: 04/19/18  6:42 AM  Result Value Ref Range   Hgb A1c MFr Bld 5.4 4.8 - 5.6 %    Comment: (NOTE) Pre diabetes:          5.7%-6.4% Diabetes:              >6.4% Glycemic control for   <7.0% adults with diabetes    Mean Plasma Glucose 108.28 mg/dL    Comment: Performed at [facility], [place] [address], [place], [place] [address]  Lipid panel     Status: Abnormal   Collection Time: 04/19/18  6:42 AM  Result Value Ref Range   Cholesterol 136 0 - 200 mg/dL   Triglycerides 182 (H) <150 mg/dL   HDL 47 >40 mg/dL   Total CHOL/HDL Ratio 2.9 RATIO   VLDL 36 0 - 40 mg/dL   LDL Cholesterol 53 0 - 99 mg/dL    Comment:        Total Cholesterol/HDL:CHD Risk Coronary Heart Disease Risk Table                     Men   Women  1/2 Average Risk   3.4   3.3  Average Risk       5.0   4.4  2  X Average Risk   9.6  7.1  3 X Average Risk  23.4   11.0        Use the calculated Patient Ratio above and the CHD Risk Table to determine the patient's CHD Risk.        ATP III CLASSIFICATION (LDL):  <100     mg/dL   Optimal  100-129  mg/dL   Near or Above                    Optimal  130-159  mg/dL   Borderline  160-189  mg/dL   High  >190     mg/dL   Very High Performed at [facility] [address], [place], [place] [address]     Blood Alcohol level:  Lab Results  Component Value Date   [facility] <10 04/17/2018   ETH <10 11/91/4782    Metabolic Disorder Labs: Lab Results  Component Value Date   HGBA1C 5.4 04/19/2018   MPG 108.28 04/19/2018   No results found for: PROLACTIN Lab Results  Component Value Date   CHOL 136 04/19/2018   TRIG 182 (H) 04/19/2018   HDL 47 04/19/2018   CHOLHDL 2.9 04/19/2018   VLDL 36 04/19/2018   LDLCALC 53 04/19/2018    Physical Findings: AIMS: Facial and Oral Movements Muscles of Facial Expression: None, normal Lips and Perioral Area: None, normal Jaw: None, normal Tongue: None, normal,Extremity Movements Upper (arms, wrists, hands, fingers): None, normal Lower (legs, knees, ankles, toes): None, normal, Trunk Movements Neck, shoulders, hips: None, normal, Overall Severity Severity of abnormal movements (highest score from questions above): None, normal Incapacitation due to abnormal movements: None, normal Patient's awareness of abnormal movements (rate only patient's report): No Awareness, Dental Status Current problems with teeth and/or dentures?: No Does patient usually wear dentures?: No  CIWA:    COWS:     Musculoskeletal: Strength & Muscle Tone: within normal limits Gait & Station: normal Patient leans: N/A  Psychiatric Specialty Exam: Physical Exam  Constitutional: He is oriented to person, place, and time. He appears well-developed and well-nourished.  HENT:  Head: Normocephalic.  Respiratory:  Effort normal.  Musculoskeletal: Normal range of motion.  Neurological: He is alert and oriented to person, place, and time.  Psychiatric: His speech is normal and behavior is normal. Judgment and thought content normal. Cognition and memory are normal. He exhibits a depressed mood.    Review of Systems  Psychiatric/Behavioral: Positive for depression.  All other systems reviewed and are negative.   Blood pressure 116/89, pulse (!) 108, temperature 98.1 F (36.7 C), temperature source Oral, resp. rate 18, height 6' (1.829 m), weight 81.6 kg.Body mass index is 24.41 kg/m.  General Appearance: Fairly Groomed  Eye Contact:  Good  Speech:  Normal Rate  Volume:  Normal  Mood:  Improving mood, presents less depressed  Affect:  Still constricted but to a lesser degree, smiles briefly at times during session  Thought Process:  Linear and Descriptions of Associations: Intact  Orientation:  Other:  Fully alert and attentive  Thought Content:  No hallucinations, no delusions  Suicidal Thoughts:  No today denies any suicidal or self-injurious ideations, also denies any homicidal or violent ideations  Homicidal Thoughts:  No  Memory:  Recent and remote grossly intact  Judgement:  Other:  Improving  Insight:  Improving  Psychomotor Activity:  Normal  Concentration:  Concentration: Good and Attention Span: Good  Recall:  Good  Fund of Knowledge:  Good  Language:  Good  Akathisia:  No  Handed:  Right  AIMS (if indicated):     Assets:  Agricultural consultant Resilience Social Support Vocational/Educational  ADL's:  Intact  Cognition:  WNL  Sleep:  Number of Hours: 5.75   Assessment:  33 year old male, divorced, currently homeless, employed.  Presented for worsening depression, neurovegetative symptoms, suicidal thoughts of running into traffic, medical history remarkable for MS.  Today patient presents with improving mood, less constricted affect, denying  suicidal ideations, presenting future oriented.  Thus far tolerating medications well, does not endorse side effects.   Treatment Plan Summary: Daily contact with patient to assess and evaluate symptoms and progress in treatment and Medication management  Treatment plan reviewed as below today 1/26 Participate in therpeutic milieu to work on coping skills and symptom reduction Treatment team working on disposition planning options Medication management: Continue: -Abilify 5 mg for mood stabilization -Increase Cymbalta to 40  mg for depression -Vistaril 25 mg every 6 hours as needed for anxiety -Trazodone 50 mg as needed for insomnia -Lyrica 75 mg for neuropathic pain   Jenne Campus, MD 04/20/2018, 11:13 AM   Patient ID: Hector Brunswick, male   DOB: 11/11/1985, 33 y.o.   MRN: [id number]

## 2018-04-21 NOTE — Plan of Care (Signed)
Nurse discussed anxiety, depression and coping skills with patient.  

## 2018-04-21 NOTE — [facility] Group Notes (Signed)
[facility] Group Notes:  (Nursing/MHT/Case Management/Adjunct)  Date:  04/21/2018  Time:  3:15 pm  Type of Therapy:  Psychoeducational Skills  Participation Level:  Active  Participation Quality:  Appropriate  Affect:  Appropriate  Cognitive:  Appropriate  Insight:  Appropriate  Engagement in Group:  Engaged  Modes of Intervention:  Education  Summary of Progress/Problems: Patient was alert and active during group appropriately.   Quintella Reichert [place] 04/21/2018, 5:02 PM

## 2018-04-21 NOTE — Progress Notes (Signed)
DAR: Pt in the dayroom with peers at the beginning of the shift. Pt concerned about his diagnosis and would like to more about his PTSD. Pt stated he want to be discharged, but not sure if he is ready and to be sure he better before d/c. Pt denies physical pain, took all his meds as scheduled. Pt's safety ensured with 15 minute and environmental checks. Pt currently denies SI/HI and A/V hallucinations. Pt verbally agrees to seek staff if SI/HI or A/VH occurs and to consult with staff before acting on these thoughts. Will continue POC.

## 2018-04-21 NOTE — [facility] Suicide Risk Assessment (Signed)
[facility] INPATIENT:  Family/Significant Other Suicide Prevention Education  Suicide Prevention Education:  Patient Refusal for Family/Significant Other Suicide Prevention Education: The patient Bob Solomon has refused to provide written consent for family/significant other to be provided Family/Significant Other Suicide Prevention Education during admission and/or prior to discharge.  Physician notified.  SPE completed with patient, as patient refused to consent to family contact. SPI pamphlet provided to pt and pt was encouraged to share information with support network, ask questions, and talk about any concerns relating to SPE. Patient denies access to guns/firearms and verbalized understanding of information provided. Mobile Crisis information also provided to patient.     [name] 04/21/2018, 11:01 AM

## 2018-04-21 NOTE — Progress Notes (Signed)
D:  Patient's self inventory sheet, patient sleeps good, sleep medication helpful.  Good appetite, normal energy level, good concentration.  Rated depression 2, hopeless 3, anxiety 4.  Denied withdrawals.  Denied SI.  Physical problems denied.  Denied physical pain.  No pain medicine.  Goal is discharge to [facility]  Does have discharge plans. A:  Medications administered per MD orders.  Emotional support and encouragement given patient. R:  Denied SI and HI, contracts for safety.  Denied A/V hallucinations.  Safety maintained with 15 minute checks.
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
## 2018-04-21 NOTE — [facility] Group Notes (Signed)
LCSW Group Therapy Note 04/21/2018 2:14 PM  Type of Therapy and Topic: Group Therapy: Overcoming Obstacles  Participation Level: Did Not Attend  Description of Group:  In this group patients will be encouraged to explore what they see as obstacles to their own wellness and recovery. They will be guided to discuss their thoughts, feelings, and behaviors related to these obstacles. The group will process together ways to cope with barriers, with attention given to specific choices patients can make. Each patient will be challenged to identify changes they are motivated to make in order to overcome their obstacles. This group will be process-oriented, with patients participating in exploration of their own experiences as well as giving and receiving support and challenge from other group members.  Therapeutic Goals: 1. Patient will identify personal and current obstacles as they relate to admission. 2. Patient will identify barriers that currently interfere with their wellness or overcoming obstacles.  3. Patient will identify feelings, thought process and behaviors related to these barriers. 4. Patient will identify two changes they are willing to make to overcome these obstacles:   Summary of Patient Progress  Invited, chose not to attend.    Therapeutic Modalities:  Cognitive Behavioral Therapy Solution Focused Therapy Motivational Interviewing Relapse Prevention Therapy   Alcario Drought Clinical Social Worker
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
## 2018-04-21 NOTE — Progress Notes (Signed)
Adult Psychoeducational Group Note  Date:  04/21/2018 Time:  8:43 PM  Group Topic/Focus:  Wrap-Up Group:   The focus of this group is to help patients review their daily goal of treatment and discuss progress on daily workbooks.  Participation Level:  Active  Participation Quality:  Appropriate  Affect:  Appropriate  Cognitive:  Appropriate  Insight: Appropriate  Engagement in Group:  Engaged  Modes of Intervention:  Discussion  Additional Comments:  Patient attended group and participated.   Bob Solomon 04/21/2018, 8:43 PM

## 2018-04-21 NOTE — Progress Notes (Signed)
Recreation Therapy Notes  Date:  1.27.20 Time: 0930 Location: [address] Dayroom  Group Topic: Stress Management  Goal Area(s) Addresses:  Patient will identify positive stress management techniques. Patient will identify benefits of using stress management post d/c.  Intervention:  Stress Management  Activity :  Meditation.  LRT introduced stress management technique of meditation.  LRT played a meditation that focused on the possibilities the day could bring.  Patients were to follow along as meditation played in order to engage in meditation.  Education:  Stress Management, Discharge Planning.   Education Outcome: Acknowledges Education  Clinical Observations/Feedback: Pt did not attend group.    Caroll Rancher, LRT/CTRS         Caroll Rancher A 04/21/2018 12:24 PM

## 2018-04-21 NOTE — Progress Notes (Signed)
D: Pt was in hallway upon initial approach.  Pt presents with depressed affect and appropriate mood.  He describes his day as "pretty good" and reports his goal is "getting out of here."  He reports he is discharging tomorrow and he feels safe to do so.  Pt reports he plans to [facility] to  [facility].  Pt denies SI/HI, denies hallucinations, denies pain.  Pt has been visible in milieu interacting with peers and staff appropriately.  Pt attended evening group.    A: Introduced self to pt.  Met with pt 1:1.  Actively listened to pt and offered support and encouragement.  PRN medication administered for anxiety and sleep.  Q15 minute safety checks maintained.  R: Pt is safe on the unit.  Pt is compliant with medications.  Pt verbally contracts for safety.  Will continue to monitor and assess.

## 2018-04-22 MED ORDER — NICOTINE 21 MG/24HR TD PT24: 21.0000 mg | MEDICATED_PATCH | Freq: Every day | TRANSDERMAL | 0 refills | Status: AC

## 2018-04-22 MED ORDER — PREGABALIN 75 MG PO CAPS: 75.0000 mg | ORAL_CAPSULE | Freq: Two times a day (BID) | ORAL | 0 refills | Status: AC

## 2018-04-22 MED ORDER — ARIPIPRAZOLE 5 MG PO TABS: 5.0000 mg | ORAL_TABLET | Freq: Every day | ORAL | 0 refills | Status: AC

## 2018-04-22 MED ORDER — DULOXETINE HCL 40 MG PO CPEP: 40.0000 mg | ORAL_CAPSULE | Freq: Every day | ORAL | 0 refills | Status: AC

## 2018-04-22 MED ORDER — HYDROXYZINE HCL 25 MG PO TABS: 25.0000 mg | ORAL_TABLET | Freq: Four times a day (QID) | ORAL | 0 refills | Status: AC | PRN

## 2018-04-22 MED ORDER — TRAZODONE HCL 50 MG PO TABS: 50.0000 mg | ORAL_TABLET | Freq: Every evening | ORAL | 0 refills | Status: AC | PRN

## 2018-04-22 NOTE — Progress Notes (Signed)
Discharge Note:  Patient discharged with bus tickets.  Patient denied SI and HI.  Denied A/V hallucinations.  Denied pain.  Suicide prevention information given and discussed with patient who stated he understood and had no questions.  My3 suicide information also given to patient.  Survey given to patient also.  Patient stated he received all his belongings, clothing, toiletries, misc items, etc.  Patient stated he appreciated all assistance received from [facility] staff.  All required discharge information given to patient at discharge.

## 2018-04-22 NOTE — Progress Notes (Signed)
D:  Patient's self inventory sheet, patient sleeps good, sleep medication helpful.  Good appetite, normal energy level, good concentration.  Denied depression, anxiety, hopeless.  Denied withdrawals.  Denied SI.  Denied physical problems.  Denied physical pain.  Goal is discharge and working.  Does have discharge plans. A:  Medications administered per MD orders.  Emotional support and encouragement given patient. R:  Denied SI and HI, contracts for safety.  Denied A/V hallucinations.  Safety maintained with 15 minute checks.

## 2018-04-22 NOTE — Progress Notes (Signed)
  [facility] Adult Case Management Discharge Plan :  Will you be returning to the same living situation after discharge:  Yes,  patient reports he is returning to a friend's home at discharge At discharge, do you have transportation home?: Yes,  bus passes Do you have the ability to pay for your medications: Yes,  Medicaid, income from employment  Release of information consent forms completed and in the chart;  Patient's signature needed at discharge.  Patient to Follow up at: Follow-up Information    [facility] Follow up on 04/30/2018.   Specialty:  Behavioral Health Why:  Hospital follow up appointment is 2/5 at 9:00a. Please bring your photo ID, proof of insurance, SSN, current medications, and discharge paperwork from this hospitalization.  Contact information: [address] [place] [address] [phone]           Next level of care provider has access to [facility] Link:yes  Safety Planning and Suicide Prevention discussed: Yes,  with the patient   Have you used any form of tobacco in the last 30 days? (Cigarettes, Smokeless Tobacco, Cigars, and/or Pipes): Yes  Has patient been referred to the Quitline?: Patient refused referral  Patient has been referred for addiction treatment: N/A  [name], LCSWA 04/22/2018, 9:38 AM

## 2018-04-22 NOTE — Discharge Summary (Signed)
Physician Discharge Summary Note  Patient:  Bob Solomon is an 33 y.o., male MRN:  [id number] DOB:  03-Nov-1985 Patient phone:  [phone] (home)  Patient address:   [address] [place] [address],  Total Time spent with patient: 15 minutes  Date of Admission:  04/18/2018 Date of Discharge: 04/22/2018  Reason for Admission:  Suicidal ideation to walk into traffic  Principal Problem: Severe recurrent major depressive disorder with psychotic features [facility]) Discharge Diagnoses: Principal Problem:   Severe recurrent major depressive disorder with psychotic features [facility])   Past Psychiatric History: Per admission H&P: First hospitalization was here 03/20/2018-03/25/2018 for SI.  Past Medical History:  Past Medical History:  Diagnosis Date  . MS (multiple sclerosis) (HCC)     Past Surgical History:  Procedure Laterality Date  . KNEE SURGERY     Family History:  Family History  Family history unknown: Yes   Family Psychiatric  History: denies Social History:  Social History   Substance and Sexual Activity  Alcohol Use Yes   Comment: Social     Social History   Substance and Sexual Activity  Drug Use Yes  . Types: Marijuana    Social History   Socioeconomic History  . Marital status: Divorced    Spouse name: Not on file  . Number of children: Not on file  . Years of education: Not on file  . Highest education level: Not on file  Occupational History  . Occupation: Financial risk analyst  Social Needs  . Financial resource strain: Not on file  . Food insecurity:    Worry: Not on file    Inability: Not on file  . Transportation needs:    Medical: Not on file    Non-medical: Not on file  Tobacco Use  . Smoking status: Current Every Day Smoker    Packs/day: 0.50    Years: 15.00    Pack years: 7.50    Types: Cigarettes  . Smokeless tobacco: Never Used  . Tobacco comment: "about 3 per day" 02/17/2018  Substance and Sexual Activity  . Alcohol use: Yes    Comment:  Social  . Drug use: Yes    Types: Marijuana  . Sexual activity: Yes    Birth control/protection: Condom  Lifestyle  . Physical activity:    Days per week: Not on file    Minutes per session: Not on file  . Stress: Not on file  Relationships  . Social connections:    Talks on phone: Not on file    Gets together: Not on file    Attends religious service: Not on file    Active member of club or organization: Not on file    Attends meetings of clubs or organizations: Not on file    Relationship status: Not on file  Other Topics Concern  . Not on file  Social History Narrative  . Not on file    Hospital Course:  From admission H&P 04/18/2018: Bob Solomon is a 33 year old male with history of depression and MS, presenting for treatment of suicidal ideation with plan to walk into traffic. He was recently admitted here 03/20/2018-03/25/2018 with similar presentation. He was discharged on Trintellix, Adderall, and Restoril and reports some compliance with these medications- "Sometimes I forget to take them." He reports worsening depression over the last few weeks with suicidal thoughts starting yesterday. He also reports new symptom of AH over the last few weeks. States AH include voice of his deceased grandmother. Denies CAH. 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 He reports stress from lack of stable housing. Denies HI. Denies current SI and contracts for safety on the unit. Denies drug, ETOH use. UDS negative. BAL<10.   Bob Solomon was admitted for suicidal ideation. He was previously admitted at [facility] 03/20/18-03/25/18 for SI and discharged on Trintellix, Adderall, and Restoril, with intermittent compliance reported. UDS was negative for amphetamines. He was started on Cymbalta and Abilify. Lyrica was continued for MS related pain. He remained on the [facility] unit for 5 days. He stabilized with medication and therapy. He was discharged on the  medications listed below. He is discharging to his friend's home. He has shown improvement with improved mood, affect, sleep, appetite, and interaction. He denies any SI/HI/AVH and contracts for safety. He agrees to follow up at [facility]. Patient is provided with prescriptions and medication samples upon discharge.  Physical Findings: AIMS: Facial and Oral Movements Muscles of Facial Expression: None, normal Lips and Perioral Area: None, normal Jaw: None, normal Tongue: None, normal,Extremity Movements Upper (arms, wrists, hands, fingers): None, normal Lower (legs, knees, ankles, toes): None, normal, Trunk Movements Neck, shoulders, hips: None, normal, Overall Severity Severity of abnormal movements (highest score from questions above): None, normal Incapacitation due to abnormal movements: None, normal Patient's awareness of abnormal movements (rate only patient's report): No Awareness, Dental Status Current problems with teeth and/or dentures?: No Does patient usually wear dentures?: No  CIWA:  CIWA-Ar Total: 1 COWS:  COWS Total Score: 2  Musculoskeletal: Strength & Muscle Tone: within normal limits Gait & Station: normal Patient leans: N/A  Psychiatric Specialty Exam: Physical Exam  Nursing note and vitals reviewed. Constitutional: He is oriented to person, place, and time. He appears well-developed and well-nourished.  Cardiovascular: Normal rate.  Respiratory: Effort normal.  Neurological: He is alert and oriented to person, place, and time.    Review of Systems  Psychiatric/Behavioral: Positive for depression (improving). Negative for hallucinations, memory loss, substance abuse and suicidal ideas. The patient is not nervous/anxious and does not have insomnia.     Blood pressure (!) 131/95, pulse 84, temperature 98.1 F (36.7 C), temperature source Oral, resp. rate 16, height 6' (1.829 m), weight 81.6 kg.Body mass index is 24.41 kg/m.  See MD's discharge SRA     Have  you used any form of tobacco in the last 30 days? (Cigarettes, Smokeless Tobacco, Cigars, and/or Pipes): Yes  Has this patient used any form of tobacco in the last 30 days? (Cigarettes, Smokeless Tobacco, Cigars, and/or Pipes) Yes, an FDA-approved medication for tobacco cessation was offered at discharge.   Blood Alcohol level:  Lab Results  Component Value Date   ETH <10 04/17/2018   ETH <10 03/20/2018    Metabolic Disorder Labs:  Lab Results  Component Value Date   HGBA1C 5.4 04/19/2018   MPG 108.28 04/19/2018   No results found for: PROLACTIN Lab Results  Component Value Date   CHOL 136 04/19/2018   TRIG 182 (H) 04/19/2018   HDL 47 04/19/2018   CHOLHDL 2.9 04/19/2018   VLDL 36 04/19/2018   LDLCALC 53 04/19/2018    See Psychiatric Specialty Exam and Suicide Risk Assessment completed by Attending Physician prior to discharge.  Discharge destination:  Home  Is patient on multiple antipsychotic therapies at discharge:  No   Has Patient had three or more failed trials of antipsychotic monotherapy by history:  No  Recommended Plan for Multiple Antipsychotic Therapies: NA  Discharge  Instructions    Discharge instructions   Complete by:  As directed    Patient is instructed to take all prescribed medications as recommended. Report any side effects or adverse reactions to your outpatient psychiatrist. Patient is instructed to abstain from alcohol and illegal drugs while on prescription medications. In the event of worsening symptoms, patient is instructed to call the crisis hotline, 911, or go to the nearest emergency department for evaluation and treatment.     Allergies as of 04/22/2018      Reactions   Shellfish Allergy Anaphylaxis   Penicillins Rash   DID THE REACTION INVOLVE: Swelling of the face/tongue/throat, SOB, or low BP? yes Sudden or severe rash/hives, skin peeling, or the inside of the mouth or nose? yes Did it require medical treatment? yes When did it last  happen?2009/2010 If all above answers are "NO", may proceed with cephalosporin use.      Medication List    STOP taking these medications   ADDERALL XR 30 MG 24 hr capsule Generic drug:  amphetamine-dextroamphetamine   temazepam 30 MG capsule Commonly known as:  RESTORIL   vortioxetine HBr 10 MG Tabs tablet Commonly known as:  TRINTELLIX     TAKE these medications     Indication  ARIPiprazole 5 MG tablet Commonly known as:  ABILIFY Take 1 tablet (5 mg total) by mouth daily. For mood Start taking on:  April 23, 2018  Indication:  Mood   DULoxetine HCl 40 MG Cpep Take 40 mg by mouth daily. For mood Start taking on:  April 23, 2018  Indication:  Mood   hydrOXYzine 25 MG tablet Commonly known as:  ATARAX/VISTARIL Take 1 tablet (25 mg total) by mouth every 6 (six) hours as needed for anxiety. What changed:    medication strength  how much to take  when to take this  Indication:  Feeling Anxious   nicotine 21 mg/24hr patch Commonly known as:  NICODERM CQ - dosed in mg/24 hours Place 1 patch (21 mg total) onto the skin daily. (May buy over the counter) Start taking on:  April 23, 2018  Indication:  Nicotine Addiction   pregabalin 75 MG capsule Commonly known as:  LYRICA Take 1 capsule (75 mg total) by mouth 2 (two) times daily. For pain What changed:  additional instructions  Indication:  Pain   traZODone 50 MG tablet Commonly known as:  DESYREL Take 1 tablet (50 mg total) by mouth at bedtime as needed for sleep.  Indication:  Trouble Sleeping      Follow-up Information    [facility] Follow up on 04/30/2018.   Specialty:  Behavioral Health Why:  Hospital follow up appointment is 2/5 at 9:00a. Please bring your photo ID, proof of insurance, SSN, current medications, and discharge paperwork from this hospitalization.  Contact informationElpidio Eric: [address] [place] [address] [phone]           Follow-up recommendations: Activity as  tolerated. Diet as recommended by primary care physician. Keep all scheduled follow-up appointments as recommended.   Comments:   Patient is instructed to take all prescribed medications as recommended. Report any side effects or adverse reactions to your outpatient psychiatrist. Patient is instructed to abstain from alcohol and illegal drugs while on prescription medications. In the event of worsening symptoms, patient is instructed to call the crisis hotline, 911, or go to the nearest emergency department for evaluation and treatment.  Signed: Aldean BakerJanet E Araceli Arango, NP 04/22/2018, 8:28 AM

## 2018-04-22 NOTE — BHH Suicide Risk Assessment (Signed)
[facility] Discharge Suicide Risk Assessment   Principal Problem: Severe recurrent major depressive disorder with psychotic features [facility]) Discharge Diagnoses: Principal Problem:   Severe recurrent major depressive disorder with psychotic features (HCC)   Total Time spent with patient: 15 minutes  Musculoskeletal: Strength & Muscle Tone: within normal limits Gait & Station: normal Patient leans: N/A  Psychiatric Specialty Exam: Review of Systems  All other systems reviewed and are negative.   Blood pressure (!) 131/95, pulse 84, temperature 98.1 F (36.7 C), temperature source Oral, resp. rate 16, height 6' (1.829 m), weight 81.6 kg.Body mass index is 24.41 kg/m.  General Appearance: Casual  Eye Contact::  Good  Speech:  Normal Rate409  Volume:  Normal  Mood:  Euthymic  Affect:  Congruent  Thought Process:  Coherent and Descriptions of Associations: Intact  Orientation:  Full (Time, Place, and Person)  Thought Content:  Logical  Suicidal Thoughts:  No  Homicidal Thoughts:  No  Memory:  Immediate;   Fair Recent;   Fair Remote;   Fair  Judgement:  Intact  Insight:  Fair  Psychomotor Activity:  Normal  Concentration:  Good  Recall:  Fair  Fund of Knowledge:Good  Language: Good  Akathisia:  Negative  Handed:  Right  AIMS (if indicated):     Assets:  Architect Resilience Social Support Vocational/Educational  Sleep:  Number of Hours: 6.75  Cognition: WNL  ADL's:  Intact   Mental Status Per Nursing Assessment::   On Admission:  Suicidal ideation indicated by patient, Suicide plan, Plan includes specific time, place, or method, Self-harm thoughts, Intention to act on suicide plan, Belief that plan would result in death  Demographic Factors:  Male  Loss Factors: NA  Historical Factors: Impulsivity  Risk Reduction Factors:   Positive social support and Positive coping skills or problem solving skills  Continued Clinical  Symptoms:  Depression:   Impulsivity  Cognitive Features That Contribute To Risk:  None    Suicide Risk:  Minimal: No identifiable suicidal ideation.  Patients presenting with no risk factors but with morbid ruminations; may be classified as minimal risk based on the severity of the depressive symptoms  Follow-up Information    [facility] Follow up on 04/30/2018.   Specialty:  Behavioral Health Why:  Hospital follow up appointment is 2/5 at 9:00a. Please bring your photo ID, proof of insurance, SSN, current medications, and discharge paperwork from this hospitalization.  Contact information: [address] [place] [address] [phone]           Plan Of Care/Follow-up recommendations:  Activity:  ad lib  Antonieta Pert, MD 04/22/2018, 7:38 AM

## 2018-06-01 ENCOUNTER — Emergency Department (HOSPITAL_COMMUNITY): Payer: Medicaid Other

## 2018-06-01 ENCOUNTER — Inpatient Hospital Stay (HOSPITAL_COMMUNITY)
Admission: EM | Admit: 2018-06-01 | Discharge: 2018-06-05 | DRG: 059 | Disposition: A | Discharge status: Home / Self Care | Payer: Medicaid Other | Attending: Internal Medicine | Admitting: Internal Medicine

## 2018-06-01 ENCOUNTER — Other Ambulatory Visit: Payer: Self-pay

## 2018-06-01 ENCOUNTER — Encounter (HOSPITAL_COMMUNITY): Payer: Self-pay | Admitting: Emergency Medicine

## 2018-06-01 DIAGNOSIS — F329 Major depressive disorder, single episode, unspecified: Secondary | ICD-10-CM | POA: Diagnosis present

## 2018-06-01 DIAGNOSIS — Z79899 Other long term (current) drug therapy: Secondary | ICD-10-CM

## 2018-06-01 DIAGNOSIS — Z880 Allergy status to penicillin: Secondary | ICD-10-CM

## 2018-06-01 DIAGNOSIS — G35 Multiple sclerosis: Principal | ICD-10-CM | POA: Diagnosis present

## 2018-06-01 DIAGNOSIS — Z91013 Allergy to seafood: Secondary | ICD-10-CM

## 2018-06-01 DIAGNOSIS — J019 Acute sinusitis, unspecified: Secondary | ICD-10-CM

## 2018-06-01 DIAGNOSIS — F909 Attention-deficit hyperactivity disorder, unspecified type: Secondary | ICD-10-CM

## 2018-06-01 DIAGNOSIS — E559 Vitamin D deficiency, unspecified: Secondary | ICD-10-CM

## 2018-06-01 DIAGNOSIS — H547 Unspecified visual loss: Secondary | ICD-10-CM | POA: Diagnosis present

## 2018-06-01 DIAGNOSIS — H6693 Otitis media, unspecified, bilateral: Secondary | ICD-10-CM | POA: Diagnosis present

## 2018-06-01 DIAGNOSIS — E871 Hypo-osmolality and hyponatremia: Secondary | ICD-10-CM | POA: Diagnosis present

## 2018-06-01 DIAGNOSIS — F17210 Nicotine dependence, cigarettes, uncomplicated: Secondary | ICD-10-CM | POA: Diagnosis present

## 2018-06-01 DIAGNOSIS — F333 Major depressive disorder, recurrent, severe with psychotic symptoms: Secondary | ICD-10-CM | POA: Diagnosis present

## 2018-06-01 LAB — CBC WITH DIFFERENTIAL/PLATELET
Abs Immature Granulocytes: 0.04 10*3/uL (ref 0.00–0.07)
Basophils Absolute: 0 10*3/uL (ref 0.0–0.1)
Basophils Relative: 1 %
Eosinophils Absolute: 0.1 10*3/uL (ref 0.0–0.5)
Eosinophils Relative: 2 %
HCT: 41.6 % (ref 39.0–52.0)
Hemoglobin: 14 g/dL (ref 13.0–17.0)
Immature Granulocytes: 1 %
Lymphocytes Relative: 28 %
Lymphs Abs: 2.3 10*3/uL (ref 0.7–4.0)
MCH: 31.4 pg (ref 26.0–34.0)
MCHC: 33.7 g/dL (ref 30.0–36.0)
MCV: 93.3 fL (ref 80.0–100.0)
Monocytes Absolute: 0.9 10*3/uL (ref 0.1–1.0)
Monocytes Relative: 11 %
Neutro Abs: 4.8 10*3/uL (ref 1.7–7.7)
Neutrophils Relative %: 57 %
Platelets: 252 10*3/uL (ref 150–400)
RBC: 4.46 MIL/uL (ref 4.22–5.81)
RDW: 13 % (ref 11.5–15.5)
WBC: 8.2 10*3/uL (ref 4.0–10.5)
nRBC: 0 % (ref 0.0–0.2)

## 2018-06-01 LAB — CBC
HCT: 41.2 % (ref 39.0–52.0)
Hemoglobin: 13.4 g/dL (ref 13.0–17.0)
MCH: 30.5 pg (ref 26.0–34.0)
MCHC: 32.5 g/dL (ref 30.0–36.0)
MCV: 93.6 fL (ref 80.0–100.0)
Platelets: 226 10*3/uL (ref 150–400)
RBC: 4.4 MIL/uL (ref 4.22–5.81)
RDW: 12.9 % (ref 11.5–15.5)
WBC: 7.1 10*3/uL (ref 4.0–10.5)
nRBC: 0 % (ref 0.0–0.2)

## 2018-06-01 LAB — URINALYSIS, ROUTINE W REFLEX MICROSCOPIC
Bilirubin Urine: NEGATIVE
Glucose, UA: NEGATIVE mg/dL
HGB URINE DIPSTICK: NEGATIVE
Ketones, ur: NEGATIVE mg/dL
Leukocytes,Ua: NEGATIVE
Nitrite: NEGATIVE
PROTEIN: NEGATIVE mg/dL
Specific Gravity, Urine: 1.027 (ref 1.005–1.030)
pH: 6 (ref 5.0–8.0)

## 2018-06-01 LAB — COMPREHENSIVE METABOLIC PANEL
ALT: 14 U/L (ref 0–44)
AST: 23 U/L (ref 15–41)
Albumin: 3.3 g/dL — ABNORMAL LOW (ref 3.5–5.0)
Alkaline Phosphatase: 76 U/L (ref 38–126)
Anion gap: 6 (ref 5–15)
BILIRUBIN TOTAL: 0.6 mg/dL (ref 0.3–1.2)
BUN: 14 mg/dL (ref 6–20)
CO2: 20 mmol/L — ABNORMAL LOW (ref 22–32)
Calcium: 8.7 mg/dL — ABNORMAL LOW (ref 8.9–10.3)
Chloride: 108 mmol/L (ref 98–111)
Creatinine, Ser: 0.99 mg/dL (ref 0.61–1.24)
GFR calc Af Amer: >60 mL/min (ref >60)
GFR calc non Af Amer: >60 mL/min (ref >60)
Glucose, Bld: 115 mg/dL — ABNORMAL HIGH (ref 70–99)
Potassium: 4.1 mmol/L (ref 3.5–5.1)
Sodium: 134 mmol/L — ABNORMAL LOW (ref 135–145)
TOTAL PROTEIN: 6.5 g/dL (ref 6.5–8.1)

## 2018-06-01 LAB — CREATININE, SERUM
Creatinine, Ser: 0.97 mg/dL (ref 0.61–1.24)
GFR calc Af Amer: >60 mL/min (ref >60)

## 2018-06-01 MED ORDER — HYDROXYZINE HCL 25 MG PO TABS: 25.0000 mg | ORAL_TABLET | Freq: Four times a day (QID) | ORAL | Status: DC | PRN

## 2018-06-01 MED ORDER — SODIUM CHLORIDE 0.9 % IV SOLN: Freq: Once | INTRAVENOUS | Status: DC

## 2018-06-01 MED ORDER — GADOBUTROL 1 MMOL/ML IV SOLN
8.0000 mL | Freq: Once | INTRAVENOUS | Status: AC | PRN
  Administered 2018-06-01: 8 mL via INTRAVENOUS

## 2018-06-01 MED ORDER — AMPHETAMINE-DEXTROAMPHET ER 10 MG PO CP24
30.0000 mg | ORAL_CAPSULE | Freq: Every day | ORAL | Status: DC
  Administered 2018-06-02 – 2018-06-05 (×4): 30 mg via ORAL
  Filled 2018-06-01 (×4): qty 3

## 2018-06-01 MED ORDER — ARIPIPRAZOLE 5 MG PO TABS
5.0000 mg | ORAL_TABLET | Freq: Every day | ORAL | Status: DC
  Administered 2018-06-01 – 2018-06-05 (×5): 5 mg via ORAL
  Filled 2018-06-01 (×5): qty 1

## 2018-06-01 MED ORDER — VITAMIN B-12 1000 MCG PO TABS
1000.0000 ug | ORAL_TABLET | Freq: Every day | ORAL | Status: DC
  Administered 2018-06-02 – 2018-06-05 (×4): 1000 ug via ORAL
  Filled 2018-06-01 (×4): qty 1

## 2018-06-01 MED ORDER — AMPHETAMINE-DEXTROAMPHET ER 30 MG PO CP24: 30.0000 mg | ORAL_CAPSULE | Freq: Every day | ORAL | Status: DC

## 2018-06-01 MED ORDER — DULOXETINE HCL 20 MG PO CPEP
40.0000 mg | ORAL_CAPSULE | Freq: Every day | ORAL | Status: DC
  Administered 2018-06-01 – 2018-06-05 (×5): 40 mg via ORAL
  Filled 2018-06-01 (×5): qty 2

## 2018-06-01 MED ORDER — TRAZODONE HCL 50 MG PO TABS: 50.0000 mg | ORAL_TABLET | Freq: Every evening | ORAL | Status: DC | PRN

## 2018-06-01 MED ORDER — SODIUM CHLORIDE 0.9 % IV BOLUS
1000.0000 mL | Freq: Once | INTRAVENOUS | Status: AC
  Administered 2018-06-01: 1000 mL via INTRAVENOUS

## 2018-06-01 MED ORDER — ONDANSETRON HCL 4 MG/2ML IJ SOLN: 4.0000 mg | Freq: Four times a day (QID) | INTRAMUSCULAR | Status: DC | PRN

## 2018-06-01 MED ORDER — MORPHINE SULFATE (PF) 4 MG/ML IV SOLN
4.0000 mg | Freq: Once | INTRAVENOUS | Status: AC
  Administered 2018-06-01: 4 mg via INTRAVENOUS
  Filled 2018-06-01: qty 1

## 2018-06-01 MED ORDER — ACETAMINOPHEN 650 MG RE SUPP: 650.0000 mg | Freq: Four times a day (QID) | RECTAL | Status: DC | PRN

## 2018-06-01 MED ORDER — ACETAMINOPHEN 325 MG PO TABS
650.0000 mg | ORAL_TABLET | Freq: Four times a day (QID) | ORAL | Status: DC | PRN
  Administered 2018-06-03: 650 mg via ORAL
  Filled 2018-06-01: qty 2

## 2018-06-01 MED ORDER — ENOXAPARIN SODIUM 40 MG/0.4ML ~~LOC~~ SOLN
40.0000 mg | SUBCUTANEOUS | Status: DC
  Administered 2018-06-01 – 2018-06-04 (×4): 40 mg via SUBCUTANEOUS
  Filled 2018-06-01 (×4): qty 0.4

## 2018-06-01 MED ORDER — SODIUM CHLORIDE 0.9 % IV SOLN
1.0000 g | INTRAVENOUS | Status: DC
  Administered 2018-06-02: 1 g via INTRAVENOUS
  Filled 2018-06-01 (×2): qty 10

## 2018-06-01 MED ORDER — SODIUM CHLORIDE 0.9 % IV SOLN
500.0000 mg | Freq: Two times a day (BID) | INTRAVENOUS | Status: DC
  Administered 2018-06-02 – 2018-06-05 (×8): 500 mg via INTRAVENOUS
  Filled 2018-06-01 (×10): qty 4

## 2018-06-01 MED ORDER — ONDANSETRON HCL 4 MG PO TABS: 4.0000 mg | ORAL_TABLET | Freq: Four times a day (QID) | ORAL | Status: DC | PRN

## 2018-06-01 MED ORDER — PREGABALIN 75 MG PO CAPS
75.0000 mg | ORAL_CAPSULE | Freq: Two times a day (BID) | ORAL | Status: DC
  Administered 2018-06-01 – 2018-06-05 (×8): 75 mg via ORAL
  Filled 2018-06-01 (×8): qty 1

## 2018-06-01 NOTE — ED Notes (Signed)
Pt arrived from MRI. Attempted phone call to give report to receiving RN, no answer

## 2018-06-01 NOTE — ED Provider Notes (Signed)
[facility] EMERGENCY DEPARTMENT Provider Note   CSN: [id number] Arrival date & time: 06/01/18  2536    History   Chief Complaint Chief Complaint  Patient presents with  . Fall  . Multiple Sclerosis    HPI Bob Solomon is a 33 y.o. male.     HPI 33 yo m with h/o MS here with double vision, left-sided weakness. Sx started approximately 2 days ago. He began to notice intermittent double vision along with left leg weakness. He felt like his leg started dragging but he had to work, so he ignored it at hte time. Over the past 24 hours, he's developed severe weakness of his left leg, mild weakness of left arm, and persistent double vision. He admits to increased stress recently as well as a "cold" last week, with one day of fever, that is improving. He is on Tysabri and last infusion was last month, is not due for 2 weeks. He states he has also had increasing LLE pain that feels like he is intermittently being hit with a hammer in his left leg. His left side is the more common side to be affected by his MS. No other complaints. No SOB. No difficulty swallowing.  Past Medical History:  Diagnosis Date  . MS (multiple sclerosis) [facility])     Patient Active Problem List   Diagnosis Date Noted  . MDD (major depressive disorder) 03/20/2018  . Severe recurrent major depressive disorder with psychotic features (HCC) 03/20/2018  . Multiple sclerosis exacerbation (HCC) 02/10/2018    Past Surgical History:  Procedure Laterality Date  . KNEE SURGERY          Home Medications    Prior to Admission medications   Medication Sig Start Date End Date Taking? Authorizing Provider  amphetamine-dextroamphetamine (ADDERALL XR) 30 MG 24 hr capsule Take 30 mg by mouth daily. 05/21/18  Yes [provider]  ARIPiprazole (ABILIFY) 5 MG tablet Take 1 tablet (5 mg total) by mouth daily. For mood 04/23/18  Yes Aldean Baker, NP  DULoxetine 40 MG CPEP Take 40 mg by mouth daily. For  mood 04/23/18  Yes Aldean Baker, NP  hydrOXYzine (ATARAX/VISTARIL) 25 MG tablet Take 1 tablet (25 mg total) by mouth every 6 (six) hours as needed for anxiety. 04/22/18  Yes Aldean Baker, NP  pregabalin (LYRICA) 75 MG capsule Take 1 capsule (75 mg total) by mouth 2 (two) times daily. For pain 04/22/18  Yes Aldean Baker, NP  traZODone (DESYREL) 50 MG tablet Take 1 tablet (50 mg total) by mouth at bedtime as needed for sleep. 04/22/18  Yes Aldean Baker, NP  vitamin B-12 (CYANOCOBALAMIN) 1000 MCG tablet Take 1,000 mcg by mouth daily.   Yes [provider]  nicotine (NICODERM CQ - DOSED IN MG/24 HOURS) 21 mg/24hr patch Place 1 patch (21 mg total) onto the skin daily. (May buy over the counter) Patient not taking: Reported on 06/01/2018 04/23/18   Aldean Baker, NP    Family History Family History  Family history unknown: Yes    Social History Social History   Tobacco Use  . Smoking status: Current Every Day Smoker    Packs/day: 0.50    Years: 15.00    Pack years: 7.50    Types: Cigarettes  . Smokeless tobacco: Never Used  . Tobacco comment: "about 3 per day" 02/17/2018  Substance Use Topics  . Alcohol use: Yes    Comment: Social  . Drug use: Yes  Types: Marijuana     Allergies   Shellfish allergy and Penicillins   Review of Systems Review of Systems  Constitutional: Negative for chills, fatigue and fever.  HENT: Negative for congestion and rhinorrhea.   Eyes: Negative for visual disturbance.  Respiratory: Negative for cough, shortness of breath and wheezing.   Cardiovascular: Negative for chest pain and leg swelling.  Gastrointestinal: Negative for abdominal pain, diarrhea, nausea and vomiting.  Genitourinary: Negative for dysuria and flank pain.  Musculoskeletal: Positive for arthralgias and gait problem. Negative for neck pain and neck stiffness.  Skin: Negative for rash and wound.  Allergic/Immunologic: Negative for immunocompromised state.  Neurological:  Positive for weakness and numbness. Negative for syncope and headaches.  All other systems reviewed and are negative.    Physical Exam Updated Vital Signs BP 111/79 (BP Location: Right Arm)   Pulse 85   Temp (!) 97.5 F (36.4 C) (Oral)   Resp 18   SpO2 99%   Physical Exam Vitals signs and nursing note reviewed.  Constitutional:      General: He is not in acute distress.    Appearance: He is well-developed.  HENT:     Head: Normocephalic and atraumatic.  Eyes:     Conjunctiva/sclera: Conjunctivae normal.  Neck:     Musculoskeletal: Neck supple.  Cardiovascular:     Rate and Rhythm: Normal rate and regular rhythm.     Heart sounds: Normal heart sounds. No murmur. No friction rub.  Pulmonary:     Effort: Pulmonary effort is normal. No respiratory distress.     Breath sounds: Normal breath sounds. No wheezing or rales.  Abdominal:     General: There is no distension.     Palpations: Abdomen is soft.     Tenderness: There is no abdominal tenderness.  Skin:    General: Skin is warm.     Capillary Refill: Capillary refill takes less than 2 seconds.  Neurological:     Mental Status: He is alert and oriented to person, place, and time.     Motor: No abnormal muscle tone.     Neurological Exam:  Mental Status: Alert and oriented to person, place, and time. Attention and concentration normal. Speech clear. Recent memory is intact. Cranial Nerves: Visual fields grossly intact. EOMI and PERRLA. No nystagmus noted. Facial sensation intact at forehead, maxillary cheek, and chin/mandible bilaterally. No facial asymmetry or weakness. Hearing grossly normal. Uvula is midline, and palate elevates symmetrically. Normal SCM and trapezius strength. Tongue midline without fasciculations. Motor: Muscle strength 4+/5 in LUE, 2/5 in LLE. 5/5 throughout RUE and RLE. Sensation: Intact to light touch in upper and lower extremities distally bilaterally but subjectively diminished in LLE Gait:  Deferred  ED Treatments / Results  Labs (all labs ordered are listed, but only abnormal results are displayed) Labs Reviewed  COMPREHENSIVE METABOLIC PANEL - Abnormal; Notable for the following components:      Result Value   Sodium 134 (*)    CO2 20 (*)    Glucose, Bld 115 (*)    Calcium 8.7 (*)    Albumin 3.3 (*)    All other components within normal limits  CBC WITH DIFFERENTIAL/PLATELET  URINALYSIS, ROUTINE W REFLEX MICROSCOPIC    EKG None  Radiology No results found.  Procedures Procedures (including critical care time)  Medications Ordered in ED Medications  0.9 %  sodium chloride infusion (has no administration in time range)  sodium chloride 0.9 % bolus 1,000 mL (1,000 mLs Intravenous New Bag/Given  06/01/18 0919)  morphine 4 MG/ML injection 4 mg (4 mg Intravenous Given 06/01/18 9604)     Initial Impression / Assessment and Plan / ED Course  I have reviewed the triage vital signs and the nursing notes.  Pertinent labs & imaging results that were available during my care of the patient were reviewed by me and considered in my medical decision making (see chart for details).  Clinical Course as of May 31 936  Sun Jun 01, 2018  0916 33 yo M here with suspected acute MS exacerbation. Possibly triggered by recent URI though could unfortunately be from his primary severe MS relapsing. Dw Dr. Laurence Slate - will check CXR, UA. Once infection ruled out, will need high-dose IV Solumedrol. No fever or signs of sepsis clinically. Admit to Hospitalist.   [CI]    Clinical Course User Index [CI] Shaune Pollack, MD       Final Clinical Impressions(s) / ED Diagnoses   Final diagnoses:  Multiple sclerosis exacerbation [facility])    ED Discharge Orders    None       Shaune Pollack, MD 06/01/18 [phone]

## 2018-06-01 NOTE — ED Triage Notes (Addendum)
Patient reports history of MS. States he felt like he had a flare coming on Saturday morning and then his L knee buckled at work causing him to fall. He reports that he has been dragging his L leg and having worsening double vision. He states that he is starting to have MS flares more often despite receiving infusions.  Patient also endorses cough, ear pain and sore throat. Pt ambulatory to triage, speech clear, a/ox4, face symmetrical, grip strength equal, nad.

## 2018-06-01 NOTE — Consult Note (Signed)
Neurology Consultation  Reason for Consult: vision impairment, and weakness  Referring Physician: Dr. Joya Martyr   CC: Double vision, left-sided weakness  History is obtained from:Chart review   HPI: Bob Solomon is a 33 y.o. male with a PMH of  MS (on Tysabri) is admitted today after experiencing diplopia and left leg weakness and pain that progressively worsened over 2 days prompting his to come in for further evaluation. MRI Brain,cervical spine, thoracic spine all with and without contrast revealed a new subcortical T2 hyperintensity is present in the right superior temporal gyrus, just posterior to the sylvian fissure.consistent with progression of the patient's multiple sclerosis.Extensive periventricular and subcortical white matter disease is otherwise stable, New sinus disease.   After further discussion he revealed that he'd been experiencing bilateral ear pain. Further evaluation of the ear, nose, and throat confirmed bilateral mid ear effusion and tympanic membrane erythema and blood present in his left ear canal; otitis media, and sinus infection. Supporting MRI results.   He admits to intermittent diplopia which is not present today during his exam. He does admits to blurriness with far distance. His left leg remains with pain which has improved after given a dose of morphine, he does take daily lyrica which his says generally helps.   ROS: A 14 point ROS was performed and is negative except as noted in the HPI.   Past Medical History:  Diagnosis Date  . MS (multiple sclerosis) (HCC)     Family History  Family history unknown: Yes   Social History:   reports that he has been smoking cigarettes. He has a 7.50 pack-year smoking history. He has never used smokeless tobacco. He reports current alcohol use. He reports current drug use. Drug: Marijuana.  Medications  Current Facility-Administered Medications:  .  0.9 %  sodium chloride infusion, , Intravenous, Once, Shaune Pollack, MD  Current Outpatient Medications:  .  amphetamine-dextroamphetamine (ADDERALL XR) 30 MG 24 hr capsule, Take 30 mg by mouth daily., Disp: , Rfl:  .  ARIPiprazole (ABILIFY) 5 MG tablet, Take 1 tablet (5 mg total) by mouth daily. For mood, Disp: 30 tablet, Rfl: 0 .  DULoxetine 40 MG CPEP, Take 40 mg by mouth daily. For mood, Disp: 30 capsule, Rfl: 0 .  hydrOXYzine (ATARAX/VISTARIL) 25 MG tablet, Take 1 tablet (25 mg total) by mouth every 6 (six) hours as needed for anxiety., Disp: 60 tablet, Rfl: 0 .  pregabalin (LYRICA) 75 MG capsule, Take 1 capsule (75 mg total) by mouth 2 (two) times daily. For pain, Disp: 14 capsule, Rfl: 0 .  traZODone (DESYREL) 50 MG tablet, Take 1 tablet (50 mg total) by mouth at bedtime as needed for sleep., Disp: 30 tablet, Rfl: 0 .  vitamin B-12 (CYANOCOBALAMIN) 1000 MCG tablet, Take 1,000 mcg by mouth daily., Disp: , Rfl:  .  nicotine (NICODERM CQ - DOSED IN MG/24 HOURS) 21 mg/24hr patch, Place 1 patch (21 mg total) onto the skin daily. (May buy over the counter) (Patient not taking: Reported on 06/01/2018), Disp: 28 patch, Rfl: 0   Exam: Current vital signs: BP 111/79 (BP Location: Right Arm)   Pulse 85   Temp (!) 97.5 F (36.4 C) (Oral)   Resp 18   SpO2 99%  Vital signs in last 24 hours: Temp:  [97.5 F (36.4 C)] 97.5 F (36.4 C) (03/08 0340) Pulse Rate:  [85-108] 85 (03/08 0807) Resp:  [15-18] 18 (03/08 0807) BP: (111-119)/(79-93) 111/79 (03/08 0807) SpO2:  [99 %] 99 % (03/08  1324)  GENERAL: Awake, alert in NAD HEENT: bilateral mid ear effusion and tympanic membrane erythema and blood present in his left ear canal. Sinus with evidence of L nare edema and pack mucopurulent secretions and erythema.  Preauricular nodes with bilateral edema   NEURO:  Mental Status: AA&Ox3  Language: speech is clear .  Naming, repetition, fluency, and comprehension intact. Cranial Nerves: PERRL 3 mm/brisk. EOMI, visual fields full, no facial asymmetry, facial  sensation intact, hearing intact, tongue/uvula/soft palate midline, normal  sternocleidomastoid and trapezius muscle strength. No evidence of tongue atrophy or fibrillations Motor: RUE 5/5, RLE 4/5, LUE 4-/5, LLE full drift unable to flex, Tone: is normal and bulk is normal  Sensation- Intact to light touch bilaterally. Upper ext's and RLE normal LLE sensitive to pin prick unable to feel presence of prick until mid shin  Coordination: FTN intact bilaterally Gait- deferred    Labs I have reviewed labs in epic and the results pertinent to this consultation are: WBC 8.2, Na 134 almost baseline but hyponatremic. All others seen below   CBC    Component Value Date/Time   WBC 8.2 06/01/2018 0359   RBC 4.46 06/01/2018 0359   HGB 14.0 06/01/2018 0359   HCT 41.6 06/01/2018 0359   PLT 252 06/01/2018 0359   MCV 93.3 06/01/2018 0359   MCH 31.4 06/01/2018 0359   MCHC 33.7 06/01/2018 0359   RDW 13.0 06/01/2018 0359   LYMPHSABS 2.3 06/01/2018 0359   MONOABS 0.9 06/01/2018 0359   EOSABS 0.1 06/01/2018 0359   BASOSABS 0.0 06/01/2018 0359    CMP     Component Value Date/Time   NA 134 (L) 06/01/2018 0359   K 4.1 06/01/2018 0359   CL 108 06/01/2018 0359   CO2 20 (L) 06/01/2018 0359   GLUCOSE 115 (H) 06/01/2018 0359   BUN 14 06/01/2018 0359   CREATININE 0.99 06/01/2018 0359   CALCIUM 8.7 (L) 06/01/2018 0359   PROT 6.5 06/01/2018 0359   ALBUMIN 3.3 (L) 06/01/2018 0359   AST 23 06/01/2018 0359   ALT 14 06/01/2018 0359   ALKPHOS 76 06/01/2018 0359   BILITOT 0.6 06/01/2018 0359   GFRNONAA >60 06/01/2018 0359   GFRAA >60 06/01/2018 0359    Lipid Panel     Component Value Date/Time   CHOL 136 04/19/2018 0642   TRIG 182 (H) 04/19/2018 0642   HDL 47 04/19/2018 0642   CHOLHDL 2.9 04/19/2018 0642   VLDL 36 04/19/2018 0642   LDLCALC 53 04/19/2018 0642     Imaging I have reviewed the images obtained:   MRI examination of the brain Cervical spine, and thoracic spine w/w/o contrast   1. New subcortical T2 hyperintensity involving the right superior temporal gyrus just posterior to the sylvian fissure measures up to 18 mm. This is consistent with progression of the patient's multiple sclerosis. 2. No enhancement or restricted diffusion to suggest other active demyelination. 3. Extensive periventricular and subcortical white matter disease is otherwise stable, consistent with the given diagnosis of multiple sclerosis. 4. Associated atrophy. 5. New sinus disease as described.   Assessment: Bob Solomon is a 33 y.o. male with a PMH of  MS (on Tysabri) is admitted today after experiencing diplopia and left leg weakness and pain that progressively worsened over 2 days prompting his to come in for further evaluation. MRI brain with a new subcortical T2 hyperintensity involving the right superior temporal gyrus just posterior to the sylvian fissure measures up to 18 mm. This is consistent with  progression of the patient's multiple Sclerosis. No enhancement or restricted diffusion to suggest other active Demyelination. There is also evidence of new sinus disease, and bilateral ear infection   Impression: Multiple sclerosis progression in the setting of Tysabri and progressive sinus infection and otitis media   Recommendations: 1) Medically manage sinus disease sinusitis noted on MRI, likely "cold" patient was referring to. Currently immunocompromised in the presence of MS drug Tysabri. Found to have bilateral ear infection and sinus infection-we will start on antibiotics; Flonase with Left nare erythema, congestion and evidence of mucopurulent secretions on exam.    2)  initiation of high dose IV steroids 500 mg BID x 5 days for MS lesion  3) PT/ OT to follow for evaluation and treatment  4) Medication prn for pain in leg currently taking Lyrica and Tylenol uncontrolled   Oralia Rud, Neuro-hospitalist, [place] NP     NEUROHOSPITALIST ADDENDUM Performed a face to face  diagnostic evaluation.   I have reviewed the contents of history and physical exam as documented by PA/ARNP/Resident and agree with above documentation.  I have discussed and formulated the above plan as documented. Edits to the note have been made as needed.  Patient is a 33 year old male with known MS on Tysabri.  Most of his care is at [facility].  Patient comes in with new left leg weakness.  MRI brain, C and T-spine performed-for subclinical T7 lesion.  No new enhancing lesions in the brain or cervical spine.  Does have a new subcortical T2 lesion involving the right temporal gyrus.  Does have sinusitis.  On examination patient has 2/5 strength in the left leg.  5/5 strength in the right leg.  5/5 strength and right upper extremity and 4+/ 5 strength in left upper extremity.  Left side has slightly increased tone.  No clonus appreciated.     MS exacerbation Sinusitis and possible otitis media  We will start patient on Solu-Medrol 500 mg 2 times daily x 5 days.  Will Start patient on ceftriaxone-hospitalist wants to change antibiotics for better sensitivity we are okay with that. Will  also order blood cultures.   Georgiana Spinner Aroor MD [facility] [phone]   If 7pm to 7am, please call on call as listed on AMION.

## 2018-06-01 NOTE — ED Notes (Signed)
Pt unable to void at this time. 

## 2018-06-01 NOTE — H&P (Signed)
History and Physical    Bob Solomon:[id number] DOB: 1985/10/06 DOA: 06/01/2018  Referring MD/NP/PA: EDP PCP:  Patient coming from: Home  Chief Complaint: Left leg weakness, double vision  HPI: Bob Solomon is a 33 y.o. male with medical history significant of multiple sclerosis, major depression, history of suicidal ideations Presented to the ED with intermittent double vision and left leg weakness for the past 2 days, he suspects having another MS flare. -He noticed over the past 48 hours that his left leg was dragging at work however continue to work despite this in addition also reports mild left arm weakness. -He is followed by neurology at [facility] for MS, on Tysabri infusions last one was in February. -denies fevers or chills, had a cold last week.  ED Course: Vital signs, labs unremarkable  Review of Systems: As per HPI otherwise 14 point review of systems negative.   Past Medical History:  Diagnosis Date  . MS (multiple sclerosis) (HCC)     Past Surgical History:  Procedure Laterality Date  . KNEE SURGERY       reports that he has been smoking cigarettes. He has a 7.50 pack-year smoking history. He has never used smokeless tobacco. He reports current alcohol use. He reports current drug use. Drug: Marijuana.  Allergies  Allergen Reactions  . Shellfish Allergy Anaphylaxis  . Penicillins Rash    DID THE REACTION INVOLVE: Swelling of the face/tongue/throat, SOB, or low BP? yes Sudden or severe rash/hives, skin peeling, or the inside of the mouth or nose? yes Did it require medical treatment? yes When did it last happen?2009/2010 If all above answers are "NO", may proceed with cephalosporin use.     Family History  Family history unknown: Yes     Prior to Admission medications   Medication Sig Start Date End Date Taking? Authorizing Provider  amphetamine-dextroamphetamine (ADDERALL XR) 30 MG 24 hr capsule Take 30 mg by mouth daily. 05/21/18  Yes  [provider]  ARIPiprazole (ABILIFY) 5 MG tablet Take 1 tablet (5 mg total) by mouth daily. For mood 04/23/18  Yes Bob Baker, NP  DULoxetine 40 MG CPEP Take 40 mg by mouth daily. For mood 04/23/18  Yes Bob Baker, NP  hydrOXYzine (ATARAX/VISTARIL) 25 MG tablet Take 1 tablet (25 mg total) by mouth every 6 (six) hours as needed for anxiety. 04/22/18  Yes Bob Baker, NP  pregabalin (LYRICA) 75 MG capsule Take 1 capsule (75 mg total) by mouth 2 (two) times daily. For pain 04/22/18  Yes Bob Baker, NP  traZODone (DESYREL) 50 MG tablet Take 1 tablet (50 mg total) by mouth at bedtime as needed for sleep. 04/22/18  Yes Bob Baker, NP  vitamin B-12 (CYANOCOBALAMIN) 1000 MCG tablet Take 1,000 mcg by mouth daily.   Yes [provider]  nicotine (NICODERM CQ - DOSED IN MG/24 HOURS) 21 mg/24hr patch Place 1 patch (21 mg total) onto the skin daily. (May buy over the counter) Patient not taking: Reported on 06/01/2018 04/23/18   Bob Baker, NP    Physical Exam: Vitals:   06/01/18 0340 06/01/18 0807  BP: (!) 119/93 111/79  Pulse: (!) 108 85  Resp: 15 18  Temp: (!) 97.5 F (36.4 C)   TempSrc: Oral   SpO2: 99% 99%      Constitutional: NAD, calm, comfortable, no distress, AAO x3 Vitals:   06/01/18 0340 06/01/18 0807  BP: (!) 119/93 111/79  Pulse: (!) 108 85  Resp: 15 18  Temp: (!) 97.5 F (36.4 C)   TempSrc: Oral   SpO2: 99% 99%   Eyes: PERRL, lids and conjunctivae normal ENMT: Mucous membranes are moist.  Neck: normal, supple Respiratory: clear to auscultation bilaterally, no wheezing, no crackles. Normal respiratory effort. No accessory muscle use.  Cardiovascular: Regular rate and rhythm, no murmurs / rubs / gallops Abdomen: soft, non tender, Bowel sounds positive.  Ext: No edema Skin: no rashes, lesions, ulcers.  Neurologic: Cranial nerves intact, left lower extremity with 2/5 weakness, mild left arm weakness as well, decreased light touch in left  lower extremity Psychiatric: Normal judgment and insight. Alert and oriented x 3. Normal mood.   Labs on Admission: I have personally reviewed following labs and imaging studies  CBC: Recent Labs  Lab 06/01/18 0359  WBC 8.2  NEUTROABS 4.8  HGB 14.0  HCT 41.6  MCV 93.3  PLT 252   Basic Metabolic Panel: Recent Labs  Lab 06/01/18 0359  NA 134*  K 4.1  CL 108  CO2 20*  GLUCOSE 115*  BUN 14  CREATININE 0.99  CALCIUM 8.7*   GFR: CrCl cannot be calculated (Unknown ideal weight.). Liver Function Tests: Recent Labs  Lab 06/01/18 0359  AST 23  ALT 14  ALKPHOS 76  BILITOT 0.6  PROT 6.5  ALBUMIN 3.3*   No results for input(s): LIPASE, AMYLASE in the last 168 hours. No results for input(s): AMMONIA in the last 168 hours. Coagulation Profile: No results for input(s): INR, PROTIME in the last 168 hours. Cardiac Enzymes: No results for input(s): CKTOTAL, CKMB, CKMBINDEX, TROPONINI in the last 168 hours. BNP (last 3 results) No results for input(s): PROBNP in the last 8760 hours. HbA1C: No results for input(s): HGBA1C in the last 72 hours. CBG: No results for input(s): GLUCAP in the last 168 hours. Lipid Profile: No results for input(s): CHOL, HDL, LDLCALC, TRIG, CHOLHDL, LDLDIRECT in the last 72 hours. Thyroid Function Tests: No results for input(s): TSH, T4TOTAL, FREET4, T3FREE, THYROIDAB in the last 72 hours. Anemia Panel: No results for input(s): VITAMINB12, FOLATE, FERRITIN, TIBC, IRON, RETICCTPCT in the last 72 hours. Urine analysis: No results found for: COLORURINE, APPEARANCEUR, LABSPEC, PHURINE, GLUCOSEU, HGBUR, BILIRUBINUR, KETONESUR, PROTEINUR, UROBILINOGEN, NITRITE, LEUKOCYTESUR Sepsis Labs: @LABRCNTIP (procalcitonin:4,lacticidven:4) )No results found for this or any previous visit (from the past 240 hour(s)).   Radiological Exams on Admission: Dg Chest 2 View  Result Date: 06/01/2018 CLINICAL DATA:  Multiple sclerosis, fall, cough EXAM: CHEST - 2 VIEW  COMPARISON:  09/17/2011. FINDINGS: The heart size and mediastinal contours are within normal limits. Both lungs are clear. The visualized skeletal structures are unremarkable. IMPRESSION: No acute abnormality of the lungs.  No focal airspace opacity. Electronically Signed   By: Lauralyn Primes M.D.   On: 06/01/2018 10:13    EKG: Independently reviewed.  Sinus rhythm, no acute ST-T wave changes  Assessment/Plan    Left leg and arm weakness -Suspected MS flare -MRI brain and cervical/T-spine pending -Neurology consulting -Further recommendations per neuro -physical therapy    MDD (major depressive disorder) Stable, denies suicidal ideation -Continue Cymbalta Abilify  History of ADHD -Continue Adderall   DVT prophylaxis: Lovenox Code Status: Full code Family Communication: No family at bedside Disposition Plan: Home pending above work-up Consults called: Neuro Admission status: Inpatient  Zannie Cove MD [facility]  06/01/2018, 11:44 AM

## 2018-06-01 NOTE — ED Notes (Signed)
ED TO INPATIENT HANDOFF REPORT  ED Nurse Name and Phone #: Raoul Pitch [phone]  S Name/Age/Gender Bob Solomon 33 y.o. male Room/Bed: 038C/038C  Code Status   Code Status: Full Code  Home/SNF/Other Home Patient oriented to: self, place, time and situation Is this baseline? Yes   Triage Complete: Triage complete  Chief Complaint fell at work/flare up ms  Triage Note Patient reports history of MS. States he felt like he had a flare coming on Saturday morning and then his L knee buckled at work causing him to fall. He reports that he has been dragging his L leg and having worsening double vision. He states that he is starting to have MS flares more often despite receiving infusions.  Patient also endorses cough, ear pain and sore throat. Pt ambulatory to triage, speech clear, a/ox4, face symmetrical, grip strength equal, nad.    Allergies Allergies  Allergen Reactions  . Shellfish Allergy Anaphylaxis  . Penicillins Rash    DID THE REACTION INVOLVE: Swelling of the face/tongue/throat, SOB, or low BP? yes Sudden or severe rash/hives, skin peeling, or the inside of the mouth or nose? yes Did it require medical treatment? yes When did it last happen?2009/2010 If all above answers are "NO", may proceed with cephalosporin use.     Level of Care/Admitting Diagnosis ED Disposition    ED Disposition Condition Comment   Admit  Hospital Area: [facility] [[id number]]  Level of Care: Med-Surg [16]  Diagnosis: Multiple sclerosis exacerbation [facility]) [[id number]]  Admitting Physician: Zannie Cove [[id number]]  Attending Physician: Zannie Cove [[id number]]  Estimated length of stay: 3 - 4 days  Certification:: I certify this patient will need inpatient services for at least 2 midnights  PT Class (Do Not Modify): Inpatient [101]  PT Acc Code (Do Not Modify): Private [1]       B Medical/Surgery History Past Medical History:  Diagnosis Date  . MS (multiple sclerosis) (HCC)     Past Surgical History:  Procedure Laterality Date  . KNEE SURGERY       A IV Location/Drains/Wounds Patient Lines/Drains/Airways Status   Active Line/Drains/Airways    Name:   Placement date:   Placement time:   Site:   Days:   Peripheral IV 06/01/18 Right Forearm   06/01/18    0916    Forearm   less than 1          Intake/Output Last 24 hours No intake or output data in the 24 hours ending 06/01/18 1256  Labs/Imaging Results for orders placed or performed during the hospital encounter of 06/01/18 (from the past 48 hour(s))  Comprehensive metabolic panel     Status: Abnormal   Collection Time: 06/01/18  3:59 AM  Result Value Ref Range   Sodium 134 (L) 135 - 145 mmol/L   Potassium 4.1 3.5 - 5.1 mmol/L   Chloride 108 98 - 111 mmol/L   CO2 20 (L) 22 - 32 mmol/L   Glucose, Bld 115 (H) 70 - 99 mg/dL   BUN 14 6 - 20 mg/dL   Creatinine, Ser 0.81 0.61 - 1.24 mg/dL   Calcium 8.7 (L) 8.9 - 10.3 mg/dL   Total Protein 6.5 6.5 - 8.1 g/dL   Albumin 3.3 (L) 3.5 - 5.0 g/dL   AST 23 15 - 41 U/L   ALT 14 0 - 44 U/L   Alkaline Phosphatase 76 38 - 126 U/L   Total Bilirubin 0.6 0.3 - 1.2 mg/dL   GFR calc non Af Amer >  60 >60 mL/min   GFR calc Af Amer >60 >60 mL/min   Anion gap 6 5 - 15    Comment: Performed at [facility], [address] [place], [place] [address]  CBC with Differential     Status: None   Collection Time: 06/01/18  3:59 AM  Result Value Ref Range   WBC 8.2 4.0 - 10.5 K/uL   RBC 4.46 4.22 - 5.81 MIL/uL   Hemoglobin 14.0 13.0 - 17.0 g/dL   HCT 45.6 25.6 - 38.9 %   MCV 93.3 80.0 - 100.0 fL   MCH 31.4 26.0 - 34.0 pg   MCHC 33.7 30.0 - 36.0 g/dL   RDW 37.3 42.8 - 76.8 %   Platelets 252 150 - 400 K/uL   nRBC 0.0 0.0 - 0.2 %   Neutrophils Relative % 57 %   Neutro Abs 4.8 1.7 - 7.7 K/uL   Lymphocytes Relative 28 %   Lymphs Abs 2.3 0.7 - 4.0 K/uL   Monocytes Relative 11 %   Monocytes Absolute 0.9 0.1 - 1.0 K/uL   Eosinophils Relative 2 %   Eosinophils  Absolute 0.1 0.0 - 0.5 K/uL   Basophils Relative 1 %   Basophils Absolute 0.0 0.0 - 0.1 K/uL   WBC Morphology See Note     Comment: >10% reactive, Benign Lymphocytes.   Immature Granulocytes 1 %   Abs Immature Granulocytes 0.04 0.00 - 0.07 K/uL    Comment: Performed at [facility], [address], [place], [place] [address]   Dg Chest 2 View  Result Date: 06/01/2018 CLINICAL DATA:  Multiple sclerosis, fall, cough EXAM: CHEST - 2 VIEW COMPARISON:  09/17/2011. FINDINGS: The heart size and mediastinal contours are within normal limits. Both lungs are clear. The visualized skeletal structures are unremarkable. IMPRESSION: No acute abnormality of the lungs.  No focal airspace opacity. Electronically Signed   By: Lauralyn Primes M.D.   On: 06/01/2018 10:13    Pending Labs Unresulted Labs (From admission, onward)    Start     Ordered   06/08/18 0500  Creatinine, serum  (enoxaparin (LOVENOX)    CrCl >/= 30 ml/min)  Weekly,   R    Comments:  while on enoxaparin therapy    06/01/18 1156   06/02/18 0500  CBC  Tomorrow morning,   R     06/01/18 1156   06/02/18 0500  Basic metabolic panel  Tomorrow morning,   R     06/01/18 1156   06/01/18 1156  CBC  (enoxaparin (LOVENOX)    CrCl >/= 30 ml/min)  Once,   R    Comments:  Baseline for enoxaparin therapy IF NOT ALREADY DRAWN.  Notify MD if PLT < 100 K.    06/01/18 1156   06/01/18 1156  Creatinine, serum  (enoxaparin (LOVENOX)    CrCl >/= 30 ml/min)  Once,   R    Comments:  Baseline for enoxaparin therapy IF NOT ALREADY DRAWN.    06/01/18 1156   06/01/18 0353  Urinalysis, Routine w reflex microscopic  ONCE - STAT,   STAT     06/01/18 0352          Vitals/Pain Today's Vitals   06/01/18 0340 06/01/18 0807 06/01/18 0920 06/01/18 1004  BP: (!) 119/93 111/79    Pulse: (!) 108 85    Resp: 15 18    Temp: (!) 97.5 F (36.4 C)     TempSrc: Oral     SpO2:  99% 99%    PainSc:   8  7     Isolation Precautions No active  isolations  Medications Medications  amphetamine-dextroamphetamine (ADDERALL XR) 24 hr capsule 30 mg (has no administration in time range)  ARIPiprazole (ABILIFY) tablet 5 mg (has no administration in time range)  DULoxetine HCl CPEP 40 mg (has no administration in time range)  hydrOXYzine (ATARAX/VISTARIL) tablet 25 mg (has no administration in time range)  traZODone (DESYREL) tablet 50 mg (has no administration in time range)  vitamin B-12 (CYANOCOBALAMIN) tablet 1,000 mcg (has no administration in time range)  pregabalin (LYRICA) capsule 75 mg (has no administration in time range)  enoxaparin (LOVENOX) injection 40 mg (has no administration in time range)  acetaminophen (TYLENOL) tablet 650 mg (has no administration in time range)    Or  acetaminophen (TYLENOL) suppository 650 mg (has no administration in time range)  ondansetron (ZOFRAN) tablet 4 mg (has no administration in time range)    Or  ondansetron (ZOFRAN) injection 4 mg (has no administration in time range)  gadobutrol (GADAVIST) 1 MMOL/ML injection 8 mL (has no administration in time range)  sodium chloride 0.9 % bolus 1,000 mL (1,000 mLs Intravenous New Bag/Given 06/01/18 0919)  morphine 4 MG/ML injection 4 mg (4 mg Intravenous Given 06/01/18 2641)    Mobility walks Low fall risk   Focused Assessments MS   R Recommendations: See Admitting Provider Note  Report given to:   Additional Notes:  Pt currently at MRI

## 2018-06-01 NOTE — ED Notes (Signed)
Scheduled meds not given due to Pt not being away at MRI at this time

## 2018-06-01 NOTE — Progress Notes (Signed)
Chaplain responded to spiritual consult.  Explained Advanced Directive document to patient.  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 Been seeking disability for many years, he recently sought new attorney to help with process.   Plans to complete AD and call Dept of Spiritual Care when complete.  Will be available. [name] Pager [phone]

## 2018-06-01 NOTE — ED Notes (Signed)
Pt transported to XRAY °

## 2018-06-01 NOTE — Progress Notes (Signed)
Lumbar spine exam not needed and ok to cancel per Dr. Laurence Slate.

## 2018-06-01 NOTE — ED Notes (Signed)
Pt transported to MRI 

## 2018-06-02 LAB — CBC
HCT: 43.8 % (ref 39.0–52.0)
Hemoglobin: 14.2 g/dL (ref 13.0–17.0)
MCH: 30.3 pg (ref 26.0–34.0)
MCHC: 32.4 g/dL (ref 30.0–36.0)
MCV: 93.6 fL (ref 80.0–100.0)
PLATELETS: 246 10*3/uL (ref 150–400)
RBC: 4.68 MIL/uL (ref 4.22–5.81)
RDW: 12.7 % (ref 11.5–15.5)
WBC: 10.5 10*3/uL (ref 4.0–10.5)
nRBC: 0 % (ref 0.0–0.2)

## 2018-06-02 LAB — BASIC METABOLIC PANEL
Anion gap: 8 (ref 5–15)
BUN: 8 mg/dL (ref 6–20)
CO2: 22 mmol/L (ref 22–32)
CREATININE: 1.01 mg/dL (ref 0.61–1.24)
Calcium: 8.6 mg/dL — ABNORMAL LOW (ref 8.9–10.3)
Chloride: 106 mmol/L (ref 98–111)
GFR calc Af Amer: >60 mL/min (ref >60)
GFR calc non Af Amer: >60 mL/min (ref >60)
Glucose, Bld: 125 mg/dL — ABNORMAL HIGH (ref 70–99)
Potassium: 4.4 mmol/L (ref 3.5–5.1)
SODIUM: 136 mmol/L (ref 135–145)

## 2018-06-02 LAB — VITAMIN D 25 HYDROXY (VIT D DEFICIENCY, FRACTURES): VIT D 25 HYDROXY: 6.5 ng/mL — AB (ref 30.0–100.0)

## 2018-06-02 MED ORDER — VITAMIN D (ERGOCALCIFEROL) 1.25 MG (50000 UNIT) PO CAPS
50000.0000 [IU] | ORAL_CAPSULE | ORAL | Status: DC
  Administered 2018-06-02: 50000 [IU] via ORAL
  Filled 2018-06-02: qty 1

## 2018-06-02 MED ORDER — PANTOPRAZOLE SODIUM 40 MG PO TBEC
40.0000 mg | DELAYED_RELEASE_TABLET | Freq: Every day | ORAL | Status: DC
  Administered 2018-06-02 – 2018-06-04 (×3): 40 mg via ORAL
  Filled 2018-06-02 (×3): qty 1

## 2018-06-02 MED ORDER — NICOTINE 7 MG/24HR TD PT24
7.0000 mg | MEDICATED_PATCH | Freq: Every day | TRANSDERMAL | Status: DC
  Administered 2018-06-02 – 2018-06-04 (×3): 7 mg via TRANSDERMAL
  Filled 2018-06-02 (×2): qty 1

## 2018-06-02 MED ORDER — CEFDINIR 300 MG PO CAPS
300.0000 mg | ORAL_CAPSULE | Freq: Two times a day (BID) | ORAL | Status: DC
  Administered 2018-06-03 – 2018-06-05 (×5): 300 mg via ORAL
  Filled 2018-06-02 (×5): qty 1

## 2018-06-02 NOTE — Progress Notes (Addendum)
PROGRESS NOTE    Bob Solomon  UJW:[id number] DOB: [birth year] DOA: 06/01/2018 PCP: [facility]  Brief Narrative: Bob Solomon is a 33 y.o. male with medical history significant of multiple sclerosis, major depression, history of suicidal ideations Presented to the ED with intermittent double vision and left leg weakness for the past 2 days, he suspects having another MS flare. -He noticed over the past 48 hours that his left leg was dragging at work however continue to work despite this in addition also reports mild left arm weakness. -He is followed by neurology at [facility] for MS, on Tysabri infusions last one was in February. -denies fevers or chills, had a cold last week.   Assessment & Plan:   Multiple sclerosis exacerbation -MRI brain noted New subcortical T2 hyperintensity involving the right superior temporal gyrus  -MRI cervical and thoracic spine  noted  stable lateral cord T2 hyperintensity consistent with demyelination at C2-3 and stable subtle posterior cord lesion at T7 on the right -Neurology following, started high-dose IV Solu-Medrol -Mild clinical improvement -Day 2/5 of IV steroids, add PPI -Continue physical therapy -Vitamin D level came back critically low at 6.5 will start replacement  Subacute sinusitis -Recent URI -Was started on IV ceftriaxone yesterday will change to oral cefdinir from tomorrow    MDD (major depressive disorder) Stable, denies suicidal ideation -Continue Cymbalta Abilify  History of ADHD -Continue Adderall  DVT prophylaxis: Lovenox Code Status: Full code Family Communication: No family at bedside Disposition Plan: Home pending above work-up  Consultants:   Neuro   Procedures:   Antimicrobials:    Subjective: -reports slight improvement in L leg strength  Objective: Vitals:   06/02/18 0007 06/02/18 0316 06/02/18 0759 06/02/18 1208  BP: 102/70 109/69 115/87 116/74  Pulse: 74 65 85 89    Resp: Temp: 98 F (36.7 C) 98.2 F (36.8 C) 98.2 F (36.8 C) 97.9 F (36.6 C)  TempSrc: Oral Oral Oral Oral  SpO2: 99% 98% 99% 98%    Intake/Output Summary (Last 24 hours) at 06/02/2018 1439 Last data filed at 06/02/2018 1300 Gross per 24 hour  Intake 910 ml  Output -  Net 910 ml   There were no vitals filed for this visit.  Examination:  Gen: Awake, Alert, Oriented X 3,  HEENT: PERRLA, Neck supple, no JVD Lungs: Good air movement bilaterally, CTAB CVS: RRR,No Gallops,Rubs or new Murmurs Abd: soft, Non tender, non distended, BS present Extremities: No  edema Skin: no new rashes Neuro: L leg weakness 2/5, decreased light touch LLE     Data Reviewed:   CBC: Recent Labs  Lab 06/01/18 0359 06/01/18 1405 06/02/18 0509  WBC 8.2 7.1 10.5  NEUTROABS 4.8  --   --   HGB 14.0 13.4 14.2  HCT 41.6 41.2 43.8  MCV 93.3 93.6 93.6  PLT 252 226 246   Basic Metabolic Panel: Recent Labs  Lab 06/01/18 0359 06/01/18 1405 06/02/18 0509  NA 134*  --  136  K 4.1  --  4.4  CL 108  --  106  CO2 20*  --  22  GLUCOSE 115*  --  125*  BUN 14  --  8  CREATININE 0.99 0.97 1.01  CALCIUM 8.7*  --  8.6*   GFR: CrCl cannot be calculated (Unknown ideal weight.). Liver Function Tests: Recent Labs  Lab 06/01/18 0359  AST 23  ALT 14  ALKPHOS 76  BILITOT 0.6  PROT 6.5  ALBUMIN  3.3*   No results for input(s): LIPASE, AMYLASE in the last 168 hours. No results for input(s): AMMONIA in the last 168 hours. Coagulation Profile: No results for input(s): INR, PROTIME in the last 168 hours. Cardiac Enzymes: No results for input(s): CKTOTAL, CKMB, CKMBINDEX, TROPONINI in the last 168 hours. BNP (last 3 results) No results for input(s): PROBNP in the last 8760 hours. HbA1C: No results for input(s): HGBA1C in the last 72 hours. CBG: No results for input(s): GLUCAP in the last 168 hours. Lipid Profile: No results for input(s): CHOL, HDL, LDLCALC, TRIG, CHOLHDL, LDLDIRECT in  the last 72 hours. Thyroid Function Tests: No results for input(s): TSH, T4TOTAL, FREET4, T3FREE, THYROIDAB in the last 72 hours. Anemia Panel: No results for input(s): VITAMINB12, FOLATE, FERRITIN, TIBC, IRON, RETICCTPCT in the last 72 hours. Urine analysis:    Component Value Date/Time   COLORURINE YELLOW 06/01/2018 1324   APPEARANCEUR CLEAR 06/01/2018 1324   LABSPEC 1.027 06/01/2018 1324   PHURINE 6.0 06/01/2018 1324   GLUCOSEU NEGATIVE 06/01/2018 1324   HGBUR NEGATIVE 06/01/2018 1324   BILIRUBINUR NEGATIVE 06/01/2018 1324   KETONESUR NEGATIVE 06/01/2018 1324   PROTEINUR NEGATIVE 06/01/2018 1324   NITRITE NEGATIVE 06/01/2018 1324   LEUKOCYTESUR NEGATIVE 06/01/2018 1324   Sepsis Labs: @LABRCNTIP (procalcitonin:4,lacticidven:4)  )No results found for this or any previous visit (from the past 240 hour(s)).       Radiology Studies: Dg Chest 2 View  Result Date: 06/01/2018 CLINICAL DATA:  Multiple sclerosis, fall, cough EXAM: CHEST - 2 VIEW COMPARISON:  09/17/2011. FINDINGS: The heart size and mediastinal contours are within normal limits. Both lungs are clear. The visualized skeletal structures are unremarkable. IMPRESSION: No acute abnormality of the lungs.  No focal airspace opacity. Electronically Signed   By: Lauralyn Primes M.D.   On: 06/01/2018 10:13   Mr Brain W And Wo Contrast  Result Date: 06/01/2018 CLINICAL DATA:  Multiple sclerosis.  New neurologic event. EXAM: MRI HEAD WITHOUT AND WITH CONTRAST TECHNIQUE: Multiplanar, multiecho pulse sequences of the brain and surrounding structures were obtained without and with intravenous contrast. CONTRAST:  8 mL Gadavist COMPARISON:  MRI brain 02/10/2018 FINDINGS: Brain: A new subcortical T2 hyperintensity is present in the right superior temporal gyrus, just posterior to the sylvian fissure. This is best visualized on image 13 of series 11 and image 55 of series 17. There is no associated enhancement or restricted diffusion with this  lesion. Extensive periventricular and subcortical T2 signal changes are present otherwise. There is diffuse involvement of the callososeptal margin. No other new lesions are present. There is no other restricted diffusion or focal enhancement. Areas of enhancement noted on the prior study are no longer present. Volume loss is present. The ventricles are of proportionate to the degree of atrophy. No significant extraaxial fluid collection is present. Postcontrast images demonstrate no pathologic enhancement. The internal auditory canals are within normal limits. The brainstem and cerebellum are within normal limits. Vascular: Flow is present in the major intracranial arteries. Skull and upper cervical spine: The craniocervical junction is normal. Upper cervical spine is within normal limits. Marrow signal is unremarkable. Sinuses/Orbits: Progressive sinus disease is present. A fluid level is present in the right maxillary sinus with associated circumferential thickening. There is diffuse opacification of ethmoid air cells, right greater than left. Bilateral mastoid effusions are present. No obstructing nasopharyngeal lesion is present. The globes and orbits are within normal limits. IMPRESSION: 1. New subcortical T2 hyperintensity involving the right superior temporal gyrus just posterior  to the sylvian fissure measures up to 18 mm. This is consistent with progression of the patient's multiple sclerosis. 2. No enhancement or restricted diffusion to suggest other active demyelination. 3. Extensive periventricular and subcortical white matter disease is otherwise stable, consistent with the given diagnosis of multiple sclerosis. 4. Associated atrophy. 5. New sinus disease as described. Electronically Signed   By: Marin Roberts M.D.   On: 06/01/2018 13:39   Mr Cervical Spine W Or Wo Contrast  Result Date: 06/01/2018 CLINICAL DATA:  Multiple sclerosis.  New neurologic event. EXAM: MRI CERVICAL SPINE WITHOUT AND  WITH CONTRAST TECHNIQUE: Multiplanar and multiecho pulse sequences of the cervical spine, to include the craniocervical junction and cervicothoracic junction, were obtained without and with intravenous contrast. CONTRAST:  8 mL Gadavist COMPARISON:  MRI cervical spine without and with contrast 02/10/2018 FINDINGS: Alignment: AP alignment is anatomic. There straightening of the normal cervical lordosis. Vertebrae: Marrow signal and vertebral body heights are normal. Cord: Abnormal signal in the right lateral aspect of the cord posteriorly at C2-3 and left lateral aspect of the cord posteriorly at C5 is stable from the prior exam. There is no enhancement associated with either of these areas. The more superior area was obscured on the prior study due to patient motion, but likely present. No new lesions are present in the cervical spine. Posterior Fossa, vertebral arteries, paraspinal tissues: Craniocervical junction is normal. Flow is present in the vertebral arteries bilaterally. Visualized intracranial contents are normal. Disc levels: No focal disc protrusion or stenosis is present. Foramina are patent bilaterally. IMPRESSION: 1. Stable lateral cord T2 hyperintensity consistent with demyelination at C2-3 on the right and C5 on the left. 2. No evidence for progression of disease in the cervical spinal cord. Electronically Signed   By: Marin Roberts M.D.   On: 06/01/2018 13:44   Mr Thoracic Spine W Wo Contrast  Result Date: 06/01/2018 CLINICAL DATA:  Multiple sclerosis, new neurologic event. EXAM: MRI THORACIC WITHOUT AND WITH CONTRAST TECHNIQUE: Multiplanar and multiecho pulse sequences of the thoracic spine were obtained without and with intravenous contrast. CONTRAST:  8 mL Gadavist COMPARISON:  MRI of the thoracic spine without and with contrast 02/10/2018 FINDINGS: MRI THORACIC SPINE FINDINGS Alignment: AP alignment is anatomic. Rightward curvature is centered at T8. Vertebrae: Marrow signal vertebral  body heights are normal. Cord: Subtle T2 hyperintense lesion is present laterally on the right in the posterior cord at T7, unchanged. No other focal cord lesions are present. There is no expansion of cord. No pathologic enhancement is present. Paraspinal and other soft tissues: Paraspinous soft tissues are within normal limits. Visualized lung fields are clear. Limited imaging of the upper abdomen is unremarkable. Disc levels: No focal disc protrusion or stenosis is present. Foramina are patent bilaterally. IMPRESSION: 1. Stable subtle posterior cord lesion at T7 on the right. 2. No new lesions or pathologic enhancement to suggest progression of the demyelinating process in the thoracic spine. 3. No focal disc disease or stenosis. Electronically Signed   By: Marin Roberts M.D.   On: 06/01/2018 13:57        Scheduled Meds: . amphetamine-dextroamphetamine  30 mg Oral Daily  . ARIPiprazole  5 mg Oral Daily  . [START ON 06/03/2018] cefdinir  300 mg Oral Q12H  . DULoxetine  40 mg Oral Daily  . enoxaparin (LOVENOX) injection  40 mg Subcutaneous Q24H  . pantoprazole  40 mg Oral Q1200  . pregabalin  75 mg Oral BID  . vitamin B-12  1,000 mcg Oral Daily   Continuous Infusions: . methylPREDNISolone (SOLU-MEDROL) injection 500 mg (06/02/18 1055)     LOS: 1 day    Time spent:  Zannie Cove, MD [facility]  06/02/2018, 2:39 PM

## 2018-06-02 NOTE — Plan of Care (Signed)
Pt is progressing toward desired goal 

## 2018-06-02 NOTE — Evaluation (Addendum)
Physical Therapy Evaluation Patient Details Name: Bob Solomon MRN: [id number] DOB: [birth year] Today's Date: 06/02/2018   History of Present Illness  Patient is a 33 y/o male who presents with diplopia and LLE weakness consistent with MS flare up. Also noted to have otitis media and sinus infection. Brain MRI- new T2 hyperintensity in right superior temporal gyrus. PMH includes MS. major depression, history of suicidal ideations.  Clinical Impression  Patient presents with left sided weakness, decreased sensation, impaired proprioception, pain, tone and diplopia s/p above. 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 Tolerated transfers and gait training with Min guard-Min A for balance/safety. Requires use of RW for support today due to LLE deficits. Due to significant improvement from yesterday, anticipate rapid improvement with strength and function after steroid doses. Currently on 1/5. Disposition pending improvement. Will follow acutely to maximize independence and mobility and for discharge planning prior to return home.     Follow Up Recommendations Home health PT;Supervision - Intermittent(pending improvement)    Equipment Recommendations  Other (comment)(TBA pending improvement)    Recommendations for Other Services       Precautions / Restrictions Precautions Precautions: Fall Restrictions Weight Bearing Restrictions: No      Mobility  Bed Mobility Overal bed mobility: Modified Independent                Transfers Overall transfer level: Needs assistance Equipment used: Rolling walker (2 wheeled) Transfers: Sit to/from Stand Sit to Stand: Min guard         General transfer comment: Min guard for safety. Stood from [employer], cues for hand placement.   Ambulation/Gait Ambulation/Gait assistance: Min guard;Min assist Gait Distance (Feet): 100 Feet Assistive device: Rolling walker (2 wheeled) Gait Pattern/deviations: Step-through  pattern;Ataxic;Narrow base of support;Decreased weight shift to left;Decreased stance time - left Gait velocity: decreased   General Gait Details: Slow, ataxic like gait with incoordination LLE; left knee instability noted. Increased WB through BUEs. Parked RW in room and furniture walker for support.   Stairs            Wheelchair Mobility    Modified Rankin (Stroke Patients Only)       Balance Overall balance assessment: Needs assistance Sitting-balance support: Feet supported;No upper extremity supported Sitting balance-Leahy Scale: Good Sitting balance - Comments: Able to donn socks without difficulty.    Standing balance support: During functional activity Standing balance-Leahy Scale: Fair Standing balance comment: Able to stand statically without UE support; requires UE support for dynamic tasks.                              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     Prior Function Level of Independence: Independent with assistive device(s)         Comments: Uses SPC as needed. 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 Has a ride to/from work.     Hand  Dominance   Dominant Hand: Left    Extremity/Trunk Assessment   Upper Extremity Assessment Upper Extremity Assessment: Defer to OT evaluation    Lower Extremity Assessment Lower Extremity Assessment: LLE deficits/detail LLE  Deficits / Details: Tone present throughout; grossly ~1/5 DF/PF, 2/5 knee extension/flexion, 1/5 hip flexion. When placed in position, able to take some resistance but gives way at all joints.  LLE Sensation: decreased light touch;decreased proprioception LLE Coordination: decreased gross motor;decreased fine motor    Cervical / Trunk Assessment Cervical / Trunk Assessment: Normal  Communication   Communication: No difficulties  Cognition Arousal/Alertness: Awake/alert Behavior During Therapy: WFL for tasks assessed/performed Overall Cognitive Status: History of cognitive impairments - at baseline                                 General Comments: for basic mobility tasks but reports his thought processes is not the best, "I know what I want to say but it doesn't always come out." This is not necessarily new.      General Comments      Exercises     Assessment/Plan    PT Assessment Patient needs continued PT services  PT Problem List Decreased strength;Decreased balance;Decreased cognition;Impaired tone;Decreased mobility;Decreased range of motion;Decreased activity tolerance;Decreased coordination;Impaired sensation       PT Treatment Interventions Functional mobility training;Balance training;Patient/family education;Gait training;Therapeutic activities;Neuromuscular re-education;Therapeutic exercise;DME instruction    PT Goals (Current goals can be found in the Care Plan section)  Acute Rehab PT Goals Patient Stated Goal: to be able to walk and get back to work PT Goal Formulation: With patient Time For Goal Achievement: 06/16/18 Potential to Achieve Goals: Good    Frequency Min 4X/week   Barriers to discharge Decreased caregiver support      Co-evaluation               AM-PAC PT "6 Clicks" Mobility  Outcome Measure Help needed turning from your back to your side while in a flat bed without using bedrails?: A Little Help needed moving from lying  on your back to sitting on the side of a flat bed without using bedrails?: None Help needed moving to and from a bed to a chair (including a wheelchair)?: None Help needed standing up from a chair using your arms (e.g., wheelchair or bedside chair)?: A Little Help needed to walk in hospital room?: A Little Help needed climbing 3-5 steps with a railing? : A Lot 6 Click Score: 19    End of Session Equipment Utilized During Treatment: Gait belt Activity Tolerance: Patient tolerated treatment well Patient left: in chair;with call bell/phone within reach Nurse Communication: Mobility status PT Visit Diagnosis: Pain;Muscle weakness (generalized) (M62.81);Difficulty in walking, not elsewhere classified (R26.2);Ataxic gait (R26.0) Pain - Right/Left: Left Pain - part of body: Leg    Time: 9449-6759 PT Time Calculation (min) (ACUTE ONLY): 23 min   Charges:   PT Evaluation $PT Eval Low Complexity: 1 Low PT Treatments $Gait Training: 8-22 mins        Mylo Red, PT, DPT Acute Rehabilitation Services Pager [phone] Office [phone]      Blake Divine A Lanier Ensign 06/02/2018, 9:36 AM

## 2018-06-03 ENCOUNTER — Other Ambulatory Visit: Payer: Self-pay

## 2018-06-03 LAB — PATHOLOGIST SMEAR REVIEW

## 2018-06-03 MED ORDER — HYDROCODONE-ACETAMINOPHEN 5-325 MG PO TABS
1.0000 | ORAL_TABLET | Freq: Four times a day (QID) | ORAL | Status: DC | PRN
  Administered 2018-06-03 – 2018-06-05 (×3): 1 via ORAL
  Filled 2018-06-03 (×3): qty 1

## 2018-06-03 NOTE — TOC Initial Note (Signed)
Transition of Care [facility]) - Initial/Assessment Note    Patient Details  Name: Bob Solomon MRN: [id number] Date of Birth: [birth year]  [facility] CM/SW Contact:    Kermit Balo, RN Phone Number: 06/03/2018, 1:22 PM  Clinical Narrative:                   Expected Discharge Plan: OP Rehab Barriers to Discharge: Continued Medical Work up   Patient Goals and CMS Choice        Expected Discharge Plan and Services Expected Discharge Plan: OP Rehab Discharge Planning Services: CM Consult   Living arrangements for the past 2 months: Single Family Home                          Prior Living Arrangements/Services Living arrangements for the past 2 months: Single Family Home Lives with:: Significant Other Patient language and need for interpreter reviewed:: Yes(no needs) Do you feel safe going back to the place where you live?: Yes      Need for Family Participation in Patient Care: No (Comment)     Criminal Activity/Legal Involvement Pertinent to Current Situation/Hospitalization: No - Comment as needed  Activities of Daily Living Home Assistive Devices/Equipment: None ADL Screening (condition at time of admission) Patient's cognitive ability adequate to safely complete daily activities?: Yes Is the patient deaf or have difficulty hearing?: No Does the patient have difficulty seeing, even when wearing glasses/contacts?: No Does the patient have difficulty concentrating, remembering, or making decisions?: No Patient able to express need for assistance with ADLs?: Yes Does the patient have difficulty dressing or bathing?: No Independently performs ADLs?: Yes (appropriate for developmental age) Does the patient have difficulty walking or climbing stairs?: Yes Weakness of Legs: Left Weakness of Arms/Hands: Left  Permission Sought/Granted                  Emotional Assessment Appearance:: Appears younger than stated age Attitude/Demeanor/Rapport:  Engaged Affect (typically observed): Accepting, Appropriate Orientation: : Oriented to Self, Oriented to Place, Oriented to  Time, Oriented to Situation   Psych Involvement: No (comment)  Admission diagnosis:  Multiple sclerosis exacerbation (HCC) [G35] Patient Active Problem List   Diagnosis Date Noted  . MDD (major depressive disorder) 03/20/2018  . Severe recurrent major depressive disorder with psychotic features (HCC) 03/20/2018  . Multiple sclerosis exacerbation (HCC) 02/10/2018   PCP:  [facility]:   [id number] - [place], [place] - [address] [place] [address] Phone: [phone] Fax: [phone]     Social Determinants of Health (SDOH) Interventions  Pt states he has issues with transportation. CM inquired about Medicaid transport. He states this program ended through [place] and he cant get even bus passes anymore. He usually takes the bus for transportation. CM called DSS and they state their transportation program is in effect and they verified he is eligible for transportation. CM will provide him with this information and place on AVS. Pt will require transport home at d/c.  Readmission Risk Interventions 30 Day Unplanned Readmission Risk Score     ED to Hosp-Admission (Current) from 06/01/2018 in [facility]  30 Day Unplanned Readmission Risk Score (%)  24 Filed at 06/03/2018 1200     This score is the patient's risk of an unplanned readmission within 30 days of being discharged (0 -100%). The score is based  on dignosis, age, lab data, medications, orders, and past utilization.   Low:  0-14.9   Medium: 15-21.9   High: 22-29.9   Extreme: 30 and above       No flowsheet data found.

## 2018-06-03 NOTE — Evaluation (Signed)
Occupational Therapy Evaluation Patient Details Name: Bob Solomon MRN: [id number] DOB: 19-Mar-1986 Today's Date: 06/03/2018    History of Present Illness Patient is a 33 y/o male who presents with diplopia and LLE weakness consistent with MS flare up. Also noted to have otitis media and sinus infection. Brain MRI- new T2 hyperintensity in right superior temporal gyrus. PMH includes MS. major depression, history of suicidal ideations.   Clinical Impression   PTA independent and working, no driving. Admitted for above, continues to be limited by impaired cognition and L hand decreased sensation.  Patient exiting shower upon therapist entering room, patient appears near baseline function for basic ADLs, functional mobility and transfers with no AD needed today and no LOB noted.  Cognition assessed with sequencing tasks, requires greatly increased time and encouragement to complete tasks--reports baseline short term memory deficits but recent increased difficulty at work with sequencing, attention and multi tasking.  Will follow while admitted to address cognition, and at this time recommend OP OT for higher level cognition (will update dc plan as appropriate).      Follow Up Recommendations  Outpatient OT;Supervision - Intermittent(OP neuro for cognition )    Equipment Recommendations  None recommended by OT    Recommendations for Other Services       Precautions / Restrictions Precautions Precautions: Fall Restrictions Weight Bearing Restrictions: No      Mobility Bed Mobility               General bed mobility comments: OOB upon entry  Transfers Overall transfer level: Needs assistance Equipment used: None Transfers: Sit to/from Stand Sit to Stand: Supervision         General transfer comment: supervision for safety, no assist required today; no losses of balance noted    Balance Overall balance assessment: Mild deficits observed, not formally tested                                          ADL either performed or assessed with clinical judgement   ADL Overall ADL's : Modified independent                                       General ADL Comments: pt appears at modified independent level for basic self care tasks (exiting shower upon therapist entering room), no AD required      Vision Baseline Vision/History: Wears glasses(contact) Wears Glasses: At all times Patient Visual Report: No change from baseline(reports blurry as does not have contacts in, denies diplopia) Vision Assessment?: Yes Eye Alignment: Within Functional Limits Ocular Range of Motion: Within Functional Limits Alignment/Gaze Preference: Within Defined Limits Tracking/Visual Pursuits: Able to track stimulus in all quads without difficulty Visual Fields: No apparent deficits     Perception     Praxis      Pertinent Vitals/Pain Pain Assessment: No/denies pain     Hand Dominance Left   Extremity/Trunk Assessment Upper Extremity Assessment Upper Extremity Assessment: LUE deficits/detail LUE Deficits / Details: strength and functional use WFL, noted decreased sensation in L UE reporting "dull"  LUE Sensation: (able to localize sensation, but reports dull ) LUE Coordination: WNL   Lower Extremity Assessment Lower Extremity Assessment: Defer to PT evaluation   Cervical / Trunk Assessment Cervical / Trunk Assessment: Normal   Communication Communication Communication: No  difficulties   Cognition Arousal/Alertness: Awake/alert Behavior During Therapy: WFL for tasks assessed/performed Overall Cognitive Status: Impaired/Different from baseline Area of Impairment: Attention;Memory;Following commands;Awareness;Problem solving                   Current Attention Level: Selective Memory: Decreased short-term memory Following Commands: Follows multi-step commands with increased time   Awareness: Emergent Problem Solving: Slow  processing;Requires verbal cues;Difficulty sequencing General Comments: pt reports hx of short term memory deficits, endorses difficulty sequencing and problem solving--greatly increased time to sequence months backwards and count by 3s in reverse--further functional cognition testing recommended with pill box test    General Comments       Exercises     Shoulder Instructions      Home Living Family/patient expects to be discharged to:: Private residence Living Arrangements: Other relatives(lives with girlfriend) Available Help at Discharge: Family;Available PRN/intermittently Type of Home: Apartment Home Access: Level entry     Home Layout: One level     Bathroom Shower/Tub: Tub/shower unit;Curtain   Firefighter: Standard     Home Equipment: Gilmer Mor - single point   Additional Comments: 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       Prior Functioning/Environment Level of Independence: Independent with assistive device(s)        Comments: Uses SPC as needed. 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 Has a ride to/from work.        OT Problem List: Decreased activity tolerance;Decreased cognition;Impaired sensation      OT Treatment/Interventions: Cognitive remediation/compensation;Therapeutic activities;Patient/family education    OT Goals(Current goals can be found in the care plan section) Acute Rehab OT Goals Patient Stated Goal: to get back to work OT Goal Formulation: With patient Time For Goal Achievement: 06/17/18 Potential to Achieve Goals: Good  OT Frequency: Min 2X/week   Barriers to D/C:            Co-evaluation              AM-PAC OT "6 Clicks" Daily Activity     Outcome Measure Help from another person eating meals?: None Help from another person taking care of personal grooming?: None Help from another person toileting, which includes using toliet, bedpan, or urinal?:  None Help from another person bathing (including washing, rinsing, drying)?: None Help from another person to put on and taking off regular upper body clothing?: None Help from another person to put on and taking off regular lower body clothing?: None 6 Click Score: 24   End of Session Equipment Utilized During Treatment: Gait belt Nurse Communication: Mobility status  Activity Tolerance: Patient tolerated treatment well Patient left: in bed;with call bell/phone within reach  OT Visit Diagnosis: Other symptoms and signs involving cognitive function                Time: 4388-8757 OT Time Calculation (min): 15 min Charges:  OT General Charges $OT Visit: 1 Visit OT Evaluation $OT Eval Low Complexity: 1 Low  Chancy Milroy, OT Acute Rehabilitation Services Pager [phone] Office [phone]   Chancy Milroy 06/03/2018, 9:47 AM

## 2018-06-03 NOTE — Progress Notes (Signed)
PROGRESS NOTE    Bob Solomon  ZOX:[id number] DOB: [birth year] DOA: 06/01/2018 PCP: [facility]  Brief Narrative: Bob Solomon is a 33 y.o. male with medical history significant of multiple sclerosis, major depression, history of suicidal ideations Presented to the ED with intermittent double vision and left leg weakness for the past 2 days, he suspects having another MS flare. -He noticed over the past 48 hours that his left leg was dragging at work however continue to work despite this in addition also reports mild left arm weakness. -He is followed by neurology at [facility] for MS, on Tysabri infusions last one was in February. -denies fevers or chills, had a cold last week. -MRI noted new MS lesion in R superior temporal gyrus  Assessment & Plan:   Multiple sclerosis exacerbation -MRI brain noted New subcortical T2 hyperintensity involving the right superior temporal gyrus  -MRI cervical and thoracic spine  noted  stable lateral cord T2 hyperintensity consistent with demyelination at C2-3 and stable subtle posterior cord lesion at T7 on the right -Neurology following, started high-dose IV Solu-Medrol on 3/8 -Continues to show good clinical improvement, day 3/5 of Solumedrol -Started on PPI -Continue physical therapy -Vitamin D level was very low at 6.5, started replacement  Subacute sinusitis -Recent URI -Continue oral cefdinir for 2 more days    MDD (major depressive disorder) Stable, denies suicidal ideation -Continue Cymbalta Abilify  History of ADHD -Continue Adderall  Severe vitamin D deficiency -Started replacement as above  DVT prophylaxis: Lovenox Code Status: Full code Family Communication: No family at bedside Disposition Plan: Home in 2 days  Consultants:   Neuro   Procedures:   Antimicrobials:    Subjective: -Continues to note considerable improvement and left leg strength  Objective: Vitals:   06/02/18 1632  06/02/18 2339 06/03/18 0326 06/03/18 0802  BP: 127/79 120/83 111/70 114/70  Pulse: 88 89 76 92  Resp: Temp: 97.6 F (36.4 C) 97.8 F (36.6 C) (!) 97.4 F (36.3 C) 98 F (36.7 C)  TempSrc: Oral Oral Oral Oral  SpO2: 99% 96% 96% 94%    Intake/Output Summary (Last 24 hours) at 06/03/2018 1116 Last data filed at 06/02/2018 1300 Gross per 24 hour  Intake 451.06 ml  Output -  Net 451.06 ml   There were no vitals filed for this visit.  Examination:  Gen: Awake, Alert, Oriented X 3, no distress HEENT: PERRLA, Neck supple, no JVD Lungs: Good air movement bilaterally, CTAB CVS: RRR,No Gallops,Rubs or new Murmurs Abd: soft, Non tender, non distended, BS present Extremities: No edema Skin: no new rashes Neuro: L leg weakness 3+/5, slightly decreased light touch LLE -considerable improvement from yesterday    Data Reviewed:   CBC: Recent Labs  Lab 06/01/18 0359 06/01/18 1405 06/02/18 0509  WBC 8.2 7.1 10.5  NEUTROABS 4.8  --   --   HGB 14.0 13.4 14.2  HCT 41.6 41.2 43.8  MCV 93.3 93.6 93.6  PLT 252 226 246   Basic Metabolic Panel: Recent Labs  Lab 06/01/18 0359 06/01/18 1405 06/02/18 0509  NA 134*  --  136  K 4.1  --  4.4  CL 108  --  106  CO2 20*  --  22  GLUCOSE 115*  --  125*  BUN 14  --  8  CREATININE 0.99 0.97 1.01  CALCIUM 8.7*  --  8.6*   GFR: CrCl cannot be calculated (Unknown ideal weight.). Liver Function Tests: Recent Labs  Lab 06/01/18 0359  AST 23  ALT 14  ALKPHOS 76  BILITOT 0.6  PROT 6.5  ALBUMIN 3.3*   No results for input(s): LIPASE, AMYLASE in the last 168 hours. No results for input(s): AMMONIA in the last 168 hours. Coagulation Profile: No results for input(s): INR, PROTIME in the last 168 hours. Cardiac Enzymes: No results for input(s): CKTOTAL, CKMB, CKMBINDEX, TROPONINI in the last 168 hours. BNP (last 3 results) No results for input(s): PROBNP in the last 8760 hours. HbA1C: No results for input(s): HGBA1C in  the last 72 hours. CBG: No results for input(s): GLUCAP in the last 168 hours. Lipid Profile: No results for input(s): CHOL, HDL, LDLCALC, TRIG, CHOLHDL, LDLDIRECT in the last 72 hours. Thyroid Function Tests: No results for input(s): TSH, T4TOTAL, FREET4, T3FREE, THYROIDAB in the last 72 hours. Anemia Panel: No results for input(s): VITAMINB12, FOLATE, FERRITIN, TIBC, IRON, RETICCTPCT in the last 72 hours. Urine analysis:    Component Value Date/Time   COLORURINE YELLOW 06/01/2018 1324   APPEARANCEUR CLEAR 06/01/2018 1324   LABSPEC 1.027 06/01/2018 1324   PHURINE 6.0 06/01/2018 1324   GLUCOSEU NEGATIVE 06/01/2018 1324   HGBUR NEGATIVE 06/01/2018 1324   BILIRUBINUR NEGATIVE 06/01/2018 1324   KETONESUR NEGATIVE 06/01/2018 1324   PROTEINUR NEGATIVE 06/01/2018 1324   NITRITE NEGATIVE 06/01/2018 1324   LEUKOCYTESUR NEGATIVE 06/01/2018 1324   Sepsis Labs: @LABRCNTIP (procalcitonin:4,lacticidven:4)  ) Recent Results (from the past 240 hour(s))  Culture, blood (routine x 2)     Status: None (Preliminary result)   Collection Time: 06/01/18 11:30 PM  Result Value Ref Range Status   Specimen Description BLOOD LEFT ANTECUBITAL  Final   Special Requests   Final    BOTTLES DRAWN AEROBIC AND ANAEROBIC Blood Culture adequate volume   Culture   Final    NO GROWTH 1 DAY Performed at [facility], [address], [place], [place] [address]    Report Status PENDING  Incomplete  Culture, blood (routine x 2)     Status: None (Preliminary result)   Collection Time: 06/01/18 11:37 PM  Result Value Ref Range Status   Specimen Description BLOOD RIGHT HAND  Final   Special Requests   Final    BOTTLES DRAWN AEROBIC AND ANAEROBIC Blood Culture results may not be optimal due to an excessive volume of blood received in culture bottles   Culture   Final    NO GROWTH 1 DAY Performed at [facility], [address], [place], [place] [address]    Report Status PENDING  Incomplete          Radiology Studies: Mr Laqueta Jean And Wo Contrast  Result Date: 06/01/2018 CLINICAL DATA:  Multiple sclerosis.  New neurologic event. EXAM: MRI HEAD WITHOUT AND WITH CONTRAST TECHNIQUE: Multiplanar, multiecho pulse sequences of the brain and surrounding structures were obtained without and with intravenous contrast. CONTRAST:  8 mL Gadavist COMPARISON:  MRI brain 02/10/2018 FINDINGS: Brain: A new subcortical T2 hyperintensity is present in the right superior temporal gyrus, just posterior to the sylvian fissure. This is best visualized on image 13 of series 11 and image 55 of series 17. There is no associated enhancement or restricted diffusion with this lesion. Extensive periventricular and subcortical T2 signal changes are present otherwise. There is diffuse involvement of the callososeptal margin. No other new lesions are present. There is no other restricted diffusion or focal enhancement. Areas of enhancement noted on the prior study are no longer present. Volume loss  is present. The ventricles are of proportionate to the degree of atrophy. No significant extraaxial fluid collection is present. Postcontrast images demonstrate no pathologic enhancement. The internal auditory canals are within normal limits. The brainstem and cerebellum are within normal limits. Vascular: Flow is present in the major intracranial arteries. Skull and upper cervical spine: The craniocervical junction is normal. Upper cervical spine is within normal limits. Marrow signal is unremarkable. Sinuses/Orbits: Progressive sinus disease is present. A fluid level is present in the right maxillary sinus with associated circumferential thickening. There is diffuse opacification of ethmoid air cells, right greater than left. Bilateral mastoid effusions are present. No obstructing nasopharyngeal lesion is present. The globes and orbits are within normal limits. IMPRESSION: 1. New subcortical T2 hyperintensity involving the right  superior temporal gyrus just posterior to the sylvian fissure measures up to 18 mm. This is consistent with progression of the patient's multiple sclerosis. 2. No enhancement or restricted diffusion to suggest other active demyelination. 3. Extensive periventricular and subcortical white matter disease is otherwise stable, consistent with the given diagnosis of multiple sclerosis. 4. Associated atrophy. 5. New sinus disease as described. Electronically Signed   By: Marin Roberts M.D.   On: 06/01/2018 13:39   Mr Cervical Spine W Or Wo Contrast  Result Date: 06/01/2018 CLINICAL DATA:  Multiple sclerosis.  New neurologic event. EXAM: MRI CERVICAL SPINE WITHOUT AND WITH CONTRAST TECHNIQUE: Multiplanar and multiecho pulse sequences of the cervical spine, to include the craniocervical junction and cervicothoracic junction, were obtained without and with intravenous contrast. CONTRAST:  8 mL Gadavist COMPARISON:  MRI cervical spine without and with contrast 02/10/2018 FINDINGS: Alignment: AP alignment is anatomic. There straightening of the normal cervical lordosis. Vertebrae: Marrow signal and vertebral body heights are normal. Cord: Abnormal signal in the right lateral aspect of the cord posteriorly at C2-3 and left lateral aspect of the cord posteriorly at C5 is stable from the prior exam. There is no enhancement associated with either of these areas. The more superior area was obscured on the prior study due to patient motion, but likely present. No new lesions are present in the cervical spine. Posterior Fossa, vertebral arteries, paraspinal tissues: Craniocervical junction is normal. Flow is present in the vertebral arteries bilaterally. Visualized intracranial contents are normal. Disc levels: No focal disc protrusion or stenosis is present. Foramina are patent bilaterally. IMPRESSION: 1. Stable lateral cord T2 hyperintensity consistent with demyelination at C2-3 on the right and C5 on the left. 2. No  evidence for progression of disease in the cervical spinal cord. Electronically Signed   By: Marin Roberts M.D.   On: 06/01/2018 13:44   Mr Thoracic Spine W Wo Contrast  Result Date: 06/01/2018 CLINICAL DATA:  Multiple sclerosis, new neurologic event. EXAM: MRI THORACIC WITHOUT AND WITH CONTRAST TECHNIQUE: Multiplanar and multiecho pulse sequences of the thoracic spine were obtained without and with intravenous contrast. CONTRAST:  8 mL Gadavist COMPARISON:  MRI of the thoracic spine without and with contrast 02/10/2018 FINDINGS: MRI THORACIC SPINE FINDINGS Alignment: AP alignment is anatomic. Rightward curvature is centered at T8. Vertebrae: Marrow signal vertebral body heights are normal. Cord: Subtle T2 hyperintense lesion is present laterally on the right in the posterior cord at T7, unchanged. No other focal cord lesions are present. There is no expansion of cord. No pathologic enhancement is present. Paraspinal and other soft tissues: Paraspinous soft tissues are within normal limits. Visualized lung fields are clear. Limited imaging of the upper abdomen is unremarkable. Disc levels: No  focal disc protrusion or stenosis is present. Foramina are patent bilaterally. IMPRESSION: 1. Stable subtle posterior cord lesion at T7 on the right. 2. No new lesions or pathologic enhancement to suggest progression of the demyelinating process in the thoracic spine. 3. No focal disc disease or stenosis. Electronically Signed   By: Marin Roberts M.D.   On: 06/01/2018 13:57        Scheduled Meds: . amphetamine-dextroamphetamine  30 mg Oral Daily  . ARIPiprazole  5 mg Oral Daily  . cefdinir  300 mg Oral Q12H  . DULoxetine  40 mg Oral Daily  . enoxaparin (LOVENOX) injection  40 mg Subcutaneous Q24H  . nicotine  7 mg Transdermal QHS  . pantoprazole  40 mg Oral Q1200  . pregabalin  75 mg Oral BID  . vitamin B-12  1,000 mcg Oral Daily  . Vitamin D (Ergocalciferol)  50,000 Units Oral Q7 days    Continuous Infusions: . methylPREDNISolone (SOLU-MEDROL) injection 500 mg (06/03/18 1112)     LOS: 2 days    Time spent:  Zannie Cove, MD [facility]  06/03/2018, 11:16 AM

## 2018-06-03 NOTE — Progress Notes (Addendum)
Physical Therapy Treatment Patient Details Name: Bob Solomon MRN: [id number] DOB: September 20, 1985 Today's Date: 06/03/2018    History of Present Illness Patient is a 33 y/o male who presents with diplopia and LLE weakness consistent with MS flare up. Also noted to have otitis media and sinus infection. Brain MRI- new T2 hyperintensity in right superior temporal gyrus. PMH includes MS. major depression, history of suicidal ideations.    PT Comments    Patient progressing well towards PT goals. Reports marked improvement in strength today. Tolerated gait training today without use of DME. Continues to have some LLE weakness but no buckling noted with ambulation. Pt with slow processing and response time. Encouraged pt to increase activity while in the hospital. 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 Will follow.    Follow Up Recommendations  Outpatient PT(pending improvement)     Equipment Recommendations  None recommended by PT    Recommendations for Other Services       Precautions / Restrictions Precautions Precautions: Fall Restrictions Weight Bearing Restrictions: No    Mobility  Bed Mobility Overal bed mobility: Independent             General bed mobility comments: OOB upon entry  Transfers Overall transfer level: Needs assistance Equipment used: None Transfers: Sit to/from Stand Sit to Stand: Supervision         General transfer comment: supervision for safety  Ambulation/Gait Ambulation/Gait assistance: Supervision Gait Distance (Feet): 200 Feet Assistive device: None Gait Pattern/deviations: Step-through pattern;Decreased stride length Gait velocity: decreased Gait velocity interpretation: 1.31 - 2.62 ft/sec, indicative of limited community ambulator General Gait Details: Slow, mostly steady gait with no knee  buckling noted today. Subjective weakness still reported.    Stairs             Wheelchair Mobility    Modified Rankin (Stroke Patients Only)       Balance Overall balance assessment: Mild deficits observed, not formally tested Sitting-balance support: Feet supported;No upper extremity supported Sitting balance-Leahy Scale: Good     Standing balance support: During functional activity Standing balance-Leahy Scale: Fair                              Cognition Arousal/Alertness: Awake/alert Behavior During Therapy: WFL for tasks assessed/performed Overall Cognitive Status: Impaired/Different from baseline Area of Impairment: Attention;Memory                   Current Attention Level: Selective Memory: Decreased short-term memory Following Commands: Follows multi-step commands with increased time   Awareness: Emergent Problem Solving: Slow processing;Requires verbal cues General Comments: Reports STM deficits and taking a little longer to process/problem solve esp at work.       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     Prior Function Level of Independence: Independent with assistive device(s)  Comments: Uses SPC as needed. 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 Has a ride to/from work.   PT Goals (current goals can now be found in the care plan section) Acute Rehab PT Goals Patient Stated Goal: to get back to work Progress towards PT  goals: Progressing toward goals    Frequency    Min 4X/week      PT Plan Discharge plan needs to be updated    Co-evaluation              AM-PAC PT "6 Clicks" Mobility   Outcome Measure  Help needed turning from your back to your side while in a flat bed without using bedrails?: None Help needed moving from lying on your back to sitting on the side of a flat bed without using bedrails?: None Help needed moving to and from a bed to a chair (including a wheelchair)?: None Help needed standing up from a chair using your arms (e.g., wheelchair or bedside chair)?: None Help needed to walk in hospital room?: None Help needed climbing 3-5 steps with a railing? : A Little 6 Click Score: 23    End of Session Equipment Utilized During Treatment: Gait belt Activity Tolerance: Patient tolerated treatment well Patient left: in bed;with call bell/phone within reach Nurse Communication: Mobility status PT Visit Diagnosis: Muscle weakness (generalized) (M62.81);Difficulty in walking, not elsewhere classified (R26.2)     Time: 6301-6010 PT Time Calculation (min) (ACUTE ONLY): 9 min  Charges:  $Gait Training: 8-22 mins                     Mylo Red, PT, DPT Acute Rehabilitation Services Pager [phone] Office [phone]       Blake Divine A Lanier Ensign 06/03/2018, 10:37 AM

## 2018-06-03 NOTE — Discharge Instructions (Signed)
Please use the Medicaid transportation to assist in getting to appointments and hospital follow up. You need to schedule your transportation 3 days prior to the appointment. [phone]

## 2018-06-04 NOTE — Progress Notes (Signed)
PROGRESS NOTE    Bob Solomon  IDH:[id number] DOB: Dec 17, 1985 DOA: 06/01/2018 PCP: [facility]  Brief Narrative: Bob Solomon is a 33 y.o. male with medical history significant of multiple sclerosis, major depression, history of suicidal ideations Presented to the ED with intermittent double vision and left leg weakness for the past 2 days, he suspects having another MS flare. -He noticed over the past 48 hours that his left leg was dragging at work however continue to work despite this in addition also reports mild left arm weakness. -He is followed by neurology at [facility] for MS, on Tysabri infusions last one was in February. -denies fevers or chills, had a cold last week. -MRI noted new MS lesion in R superior temporal gyrus -Started on high-dose IV Solu-Medrol per neurology, continues to improve, day 4/5 now  Assessment & Plan:   Multiple sclerosis exacerbation -MRI brain noted New subcortical T2 hyperintensity involving the right superior temporal gyrus  -MRI cervical and thoracic spine  noted  stable lateral cord T2 hyperintensity consistent with demyelination at C2-3 and stable subtle posterior cord lesion at T7 on the right -Neurology following, started high-dose IV Solu-Medrol on 3/8 -Continues to have very good clinical improvement, day 4/5 of Solu-Medrol today, also started on PPI for GI prophylaxis  -Continue physical therapy  -Vitamin D level was very low at 6.5, started replacement -Neuro to reassess tomorrow  Subacute sinusitis -Recent URI -Continue oral cefdinir for 1 more day    MDD (major depressive disorder) Stable, denies suicidal ideation -Continue Cymbalta Abilify  History of ADHD -Continue Adderall  Severe vitamin D deficiency -Started replacement as above  DVT prophylaxis: Lovenox Code Status: Full code Family Communication: No family at bedside Disposition Plan: Home tomorrow after completing 5 days of IV Solu-Medrol   Consultants:   Neuro   Procedures:   Antimicrobials:    Subjective: -Continues to note considerable improvement and left leg strength  Objective: Vitals:   06/03/18 2300 06/04/18 0000 06/04/18 0400 06/04/18 0810  BP: 112/73  110/70 121/75  Pulse: 82  86 81  Resp: 16  17 18   Temp: 98.2 F (36.8 C)  98.4 F (36.9 C) 98.2 F (36.8 C)  TempSrc: Oral  Oral Oral  SpO2: 98%  99% 98%  Height:  6' (1.829 m)      Intake/Output Summary (Last 24 hours) at 06/04/2018 1031 Last data filed at 06/03/2018 2300 Gross per 24 hour  Intake 1048.94 ml  Output 800 ml  Net 248.94 ml   There were no vitals filed for this visit.  Examination:  Gen: Awake, Alert, Oriented X 3, no distress HEENT: PERRLA, Neck supple, no JVD Lungs: Good air movement bilaterally, CTAB CVS: RRR,No Gallops,Rubs or new Murmurs Abd: soft, Non tender, non distended, BS present Extremities: No edema Skin: no new rashes Neuro: L leg weakness 4/5, slightly decreased light touch LLE continues to show strength improvement    Data Reviewed:   CBC: Recent Labs  Lab 06/01/18 0359 06/01/18 1405 06/02/18 0509  WBC 8.2 7.1 10.5  NEUTROABS 4.8  --   --   HGB 14.0 13.4 14.2  HCT 41.6 41.2 43.8  MCV 93.3 93.6 93.6  PLT 252 226 246   Basic Metabolic Panel: Recent Labs  Lab 06/01/18 0359 06/01/18 1405 06/02/18 0509  NA 134*  --  136  K 4.1  --  4.4  CL 108  --  106  CO2 20*  --  22  GLUCOSE 115*  --  125*  BUN 14  --  8  CREATININE 0.99 0.97 1.01  CALCIUM 8.7*  --  8.6*   GFR: CrCl cannot be calculated (Unknown ideal weight.). Liver Function Tests: Recent Labs  Lab 06/01/18 0359  AST 23  ALT 14  ALKPHOS 76  BILITOT 0.6  PROT 6.5  ALBUMIN 3.3*   No results for input(s): LIPASE, AMYLASE in the last 168 hours. No results for input(s): AMMONIA in the last 168 hours. Coagulation Profile: No results for input(s): INR, PROTIME in the last 168 hours. Cardiac Enzymes: No results for input(s):  CKTOTAL, CKMB, CKMBINDEX, TROPONINI in the last 168 hours. BNP (last 3 results) No results for input(s): PROBNP in the last 8760 hours. HbA1C: No results for input(s): HGBA1C in the last 72 hours. CBG: No results for input(s): GLUCAP in the last 168 hours. Lipid Profile: No results for input(s): CHOL, HDL, LDLCALC, TRIG, CHOLHDL, LDLDIRECT in the last 72 hours. Thyroid Function Tests: No results for input(s): TSH, T4TOTAL, FREET4, T3FREE, THYROIDAB in the last 72 hours. Anemia Panel: No results for input(s): VITAMINB12, FOLATE, FERRITIN, TIBC, IRON, RETICCTPCT in the last 72 hours. Urine analysis:    Component Value Date/Time   COLORURINE YELLOW 06/01/2018 1324   APPEARANCEUR CLEAR 06/01/2018 1324   LABSPEC 1.027 06/01/2018 1324   PHURINE 6.0 06/01/2018 1324   GLUCOSEU NEGATIVE 06/01/2018 1324   HGBUR NEGATIVE 06/01/2018 1324   BILIRUBINUR NEGATIVE 06/01/2018 1324   KETONESUR NEGATIVE 06/01/2018 1324   PROTEINUR NEGATIVE 06/01/2018 1324   NITRITE NEGATIVE 06/01/2018 1324   LEUKOCYTESUR NEGATIVE 06/01/2018 1324   Sepsis Labs: @LABRCNTIP (procalcitonin:4,lacticidven:4)  ) Recent Results (from the past 240 hour(s))  Culture, blood (routine x 2)     Status: None (Preliminary result)   Collection Time: 06/01/18 11:30 PM  Result Value Ref Range Status   Specimen Description BLOOD LEFT ANTECUBITAL  Final   Special Requests   Final    BOTTLES DRAWN AEROBIC AND ANAEROBIC Blood Culture adequate volume   Culture   Final    NO GROWTH 2 DAYS Performed at [facility], [address] [place], [place] [address]    Report Status PENDING  Incomplete  Culture, blood (routine x 2)     Status: None (Preliminary result)   Collection Time: 06/01/18 11:37 PM  Result Value Ref Range Status   Specimen Description BLOOD RIGHT HAND  Final   Special Requests   Final    BOTTLES DRAWN AEROBIC AND ANAEROBIC Blood Culture results may not be optimal due to an excessive volume of blood received  in culture bottles   Culture   Final    NO GROWTH 2 DAYS Performed at [facility], [address], [place], [place] [address]    Report Status PENDING  Incomplete         Radiology Studies: No results found.      Scheduled Meds: . amphetamine-dextroamphetamine  30 mg Oral Daily  . ARIPiprazole  5 mg Oral Daily  . cefdinir  300 mg Oral Q12H  . DULoxetine  40 mg Oral Daily  . enoxaparin (LOVENOX) injection  40 mg Subcutaneous Q24H  . nicotine  7 mg Transdermal QHS  . pantoprazole  40 mg Oral Q1200  . pregabalin  75 mg Oral BID  . vitamin B-12  1,000 mcg Oral Daily  . Vitamin D (Ergocalciferol)  50,000 Units Oral Q7 days   Continuous Infusions: . methylPREDNISolone (SOLU-MEDROL) injection 500 mg (06/04/18 0919)     LOS: 3  days    Time spent:  Zannie Cove, MD [facility]  06/04/2018, 10:31 AM

## 2018-06-04 NOTE — Progress Notes (Signed)
Day 4/5 IV methylprednisolone today. Neurology will re-assess tomorrow after final dose.   Electronically signed: Dr. Caryl Pina

## 2018-06-04 NOTE — Progress Notes (Signed)
Occupational Therapy Treatment Patient Details Name: Bob Solomon MRN: [id number] DOB: 03-08-1986 Today's Date: 06/04/2018    History of present illness Patient is a 33 y/o male who presents with diplopia and LLE weakness consistent with MS flare up. Also noted to have otitis media and sinus infection. Brain MRI- new T2 hyperintensity in right superior temporal gyrus. PMH includes MS. major depression, history of suicidal ideations.   OT comments  Pt reports that he is "feeling much better because of the medicine". Pt assessed using the Pill Box Test" an Ecological Measure of Executive Functioning and Estimate of Medication Management Abilities. Pt did not have any errors and completely the assessment in a timely manner. Pt states that he has difficulty remembering to take his medications and most likely misses "6 pills a week". Educated pt on compensatory strategies and importance of compliance with medication management. Pt states his thinking "gets cloudy" at times. Recommend pt's girlfriend assist with medication and financial management. Pt to follow up with outpt OT to facilitate strategies to increase success at work with deficits with memory and attention. All further OT to be addressed at outpt.   Follow Up Recommendations  Outpatient OT;Supervision - Intermittent    Equipment Recommendations  None recommended by OT    Recommendations for Other Services      Precautions / Restrictions Precautions Precautions: None Restrictions Weight Bearing Restrictions: No       Mobility Bed Mobility                  Transfers Overall transfer level: Modified independent                    Balance                                           ADL either performed or assessed with clinical judgement   ADL Overall ADL's : At baseline                                             Vision   Additional Comments: no diplopia at this time;  has glasses that are partially occluded if needed   Perception     Praxis      Cognition Arousal/Alertness: Awake/alert Behavior During Therapy: WFL for tasks assessed/performed Overall Cognitive Status: History of cognitive impairments - at baseline                                 General Comments: Assessed with the Pill Box Test: no errors; completed in less than 5 min. Pt reports he has difficulty remembering to take his medication. disucssed compensatory strategies; setting alarms that have a different sound/vibration; discused having a "buddy" to remind him of taking his meds        Exercises     Shoulder Instructions       General Comments      Pertinent Vitals/ Pain       Pain Assessment: No/denies pain  Home Living  Prior Functioning/Environment              Frequency  Min 2X/week        Progress Toward Goals  OT Goals(current goals can now be found in the care plan section)  Progress towards OT goals: Progressing toward goals  Acute Rehab OT Goals Patient Stated Goal: to get back to work OT Goal Formulation: With patient Time For Goal Achievement: 06/17/18 Potential to Achieve Goals: Good ADL Goals Additional ADL Goal #1: Pt will complete 4 step IADL tasks with moderate distractions with independence. Additional ADL Goal #2: Pt will complete 4 part trail making tasks with independence. Additional ADL Goal #3: Pt will utilize compensatory stratigies for memory with independence.  Plan All goals met and education completed, patient discharged from OT services;Discharge plan remains appropriate    Co-evaluation                 AM-PAC OT "6 Clicks" Daily Activity     Outcome Measure   Help from another person eating meals?: None Help from another person taking care of personal grooming?: None Help from another person toileting, which includes using toliet, bedpan, or  urinal?: None Help from another person bathing (including washing, rinsing, drying)?: None Help from another person to put on and taking off regular upper body clothing?: None Help from another person to put on and taking off regular lower body clothing?: None 6 Click Score: 24    End of Session    OT Visit Diagnosis: Other symptoms and signs involving cognitive function   Activity Tolerance Patient tolerated treatment well   Patient Left in bed;with call bell/phone within reach   Nurse Communication Mobility status        Time: 1150-1208 OT Time Calculation (min): 18 min  Charges: OT General Charges $OT Visit: 1 Visit OT Treatments $Therapeutic Activity: 8-22 mins   , OT/L   Acute OT Clinical Specialist Acute Rehabilitation Services Pager [phone] Office [phone]    ,HILLARY 06/04/2018, 12:54 PM    

## 2018-06-04 NOTE — Progress Notes (Signed)
Physical Therapy Treatment Patient Details Name: Bob Solomon MRN: [id number] DOB: 1985-08-09 Today's Date: 06/04/2018    History of Present Illness Patient is a 33 y/o male who presents with diplopia and LLE weakness consistent with MS flare up. Also noted to have otitis media and sinus infection. Brain MRI- new T2 hyperintensity in right superior temporal gyrus. PMH includes MS. major depression, history of suicidal ideations.    PT Comments    Pt making steady progress with functional mobility. He participated in stair training this session with no difficulties. No LOB or need for physical assistance with mobility. Pt also participated in a higher level balance assessment and scored a 23/24 on the DGI, indicating that he is a safe community ambulator. PT will continue to follow acutely to progress mobility as tolerated.     Follow Up Recommendations  No PT follow up     Equipment Recommendations  None recommended by PT    Recommendations for Other Services       Precautions / Restrictions Precautions Precautions: None Restrictions Weight Bearing Restrictions: No    Mobility  Bed Mobility Overal bed mobility: Independent                Transfers Overall transfer level: Independent Equipment used: None                Ambulation/Gait Ambulation/Gait assistance: Supervision Gait Distance (Feet): 200 Feet Assistive device: None Gait Pattern/deviations: Step-through pattern;Decreased stride length Gait velocity: decreased   General Gait Details: pt with mild instability but no overt LOB or need for physical assistance, supervision for safety   Stairs Stairs: Yes Stairs assistance: Supervision Stair Management: Two rails;Alternating pattern;Forwards Number of Stairs: 3(x3 rounds)     Wheelchair Mobility    Modified Rankin (Stroke Patients Only)       Balance Overall balance assessment: Needs assistance Sitting-balance support: Feet  supported Sitting balance-Leahy Scale: Good     Standing balance support: No upper extremity supported;During functional activity Standing balance-Leahy Scale: Good                   Standardized Balance Assessment Standardized Balance Assessment : Dynamic Gait Index   Dynamic Gait Index Level Surface: Normal Change in Gait Speed: Normal Gait with Horizontal Head Turns: Normal Gait with Vertical Head Turns: Normal Gait and Pivot Turn: Normal Step Over Obstacle: Normal Step Around Obstacles: Normal Steps: Mild Impairment Total Score: 23      Cognition Arousal/Alertness: Awake/alert Behavior During Therapy: WFL for tasks assessed/performed Overall Cognitive Status: Within Functional Limits for tasks assessed                                 General Comments: Assessed with the Pill Box Test: no errors; completed in less than 5 min. Pt reports he has difficulty remembering to take his medication. disucssed compensatory strategies; setting alarms that have a different sound/vibration; discused having a "buddy" to remind him of taking his meds      Exercises      General Comments        Pertinent Vitals/Pain Pain Assessment: No/denies pain    Home Living                      Prior Function            PT Goals (current goals can now be found in the care plan section) Acute Rehab  PT Goals Patient Stated Goal: to get back to work PT Goal Formulation: With patient Time For Goal Achievement: 06/16/18 Potential to Achieve Goals: Good Progress towards PT goals: Progressing toward goals    Frequency    Min 3X/week      PT Plan Discharge plan needs to be updated;Frequency needs to be updated    Co-evaluation              AM-PAC PT "6 Clicks" Mobility   Outcome Measure  Help needed turning from your back to your side while in a flat bed without using bedrails?: None Help needed moving from lying on your back to sitting on the  side of a flat bed without using bedrails?: None Help needed moving to and from a bed to a chair (including a wheelchair)?: None Help needed standing up from a chair using your arms (e.g., wheelchair or bedside chair)?: None Help needed to walk in hospital room?: None Help needed climbing 3-5 steps with a railing? : None 6 Click Score: 24    End of Session   Activity Tolerance: Patient tolerated treatment well Patient left: with call bell/phone within reach;with nursing/sitter in room;Other (comment)(standing at sink with nurse tech changing bed linens) Nurse Communication: Mobility status PT Visit Diagnosis: Other abnormalities of gait and mobility (R26.89)     Time: 6754-4920 PT Time Calculation (min) (ACUTE ONLY): 15 min  Charges:  $Gait Training: 8-22 mins                     Bob Solomon, [place], DPT  Acute Rehabilitation Services Pager [phone] Office [phone]     Bob Solomon Bob Solomon 06/04/2018, 3:15 PM

## 2018-06-05 DIAGNOSIS — J019 Acute sinusitis, unspecified: Secondary | ICD-10-CM

## 2018-06-05 DIAGNOSIS — F909 Attention-deficit hyperactivity disorder, unspecified type: Secondary | ICD-10-CM

## 2018-06-05 DIAGNOSIS — E559 Vitamin D deficiency, unspecified: Secondary | ICD-10-CM

## 2018-06-05 MED ORDER — HYDROCODONE-ACETAMINOPHEN 5-325 MG PO TABS: 1.0000 | ORAL_TABLET | Freq: Four times a day (QID) | ORAL | 0 refills | Status: AC | PRN

## 2018-06-05 NOTE — Progress Notes (Signed)
Chaplain responded to spiritual consult.  Patient not in bed.  Chaplain will stop back later. [name] Pager [phone]

## 2018-06-05 NOTE — Progress Notes (Addendum)
NEUROLOGY PROGRESS NOTE  Subjective: Patient at this time has no complaints.  States he feels back to normal.  Exam: Vitals:   06/05/18 0340 06/05/18 0900  BP: 129/81 119/88  Pulse: 66 62  Resp: 18 18  Temp: 98.2 F (36.8 C) 97.8 F (36.6 C)  SpO2: 99% 96%    Physical Exam   HEENT-  Normocephalic, no lesions, without obvious abnormality.  Normal external eye and conjunctiva.   Extremities- Warm, dry and intact Musculoskeletal-no joint tenderness, deformity or swelling Skin-warm and dry, no hyperpigmentation, vitiligo, or suspicious lesions  Neuro:  Mental Status: Alert, oriented, thought content appropriate.  Speech fluent without evidence of aphasia.  Able to follow 3 step commands without difficulty. Cranial Nerves: II:  Visual fields grossly normal,  III,IV, VI: ptosis not present, extra-ocular motions intact bilaterally pupils equal, round, reactive to light and accommodation V,VII: smile symmetric, facial light touch sensation normal bilaterally VIII: hearing normal bilaterally IX,X: uvula rises midline XI: bilateral shoulder shrug XII: midline tongue extension Motor: Right : Upper extremity   5/5    Left:     Upper extremity   5/5  Lower extremity   5/5     Lower extremity   5/5 Tone and bulk:normal tone throughout; no atrophy noted Sensory: Pinprick and light touch intact throughout, bilaterally Deep Tendon Reflexes: 2+ and symmetric throughout Plantars: Right: downgoing   Left: downgoing Cerebellar: normal finger-to-nose, normal rapid alternating movements and normal heel-to-shin test Gait: normal gait and station    Medications:  Scheduled: . amphetamine-dextroamphetamine  30 mg Oral Daily  . ARIPiprazole  5 mg Oral Daily  . cefdinir  300 mg Oral Q12H  . DULoxetine  40 mg Oral Daily  . enoxaparin (LOVENOX) injection  40 mg Subcutaneous Q24H  . nicotine  7 mg Transdermal QHS  . pantoprazole  40 mg Oral Q1200  . pregabalin  75 mg Oral BID  . vitamin  B-12  1,000 mcg Oral Daily  . Vitamin D (Ergocalciferol)  50,000 Units Oral Q7 days    Pertinent Labs/Diagnostics: None  No results found.   Felicie Morn PA-C [facility] [phone]   Assessment:  33 year old male with known multiple sclerosis.  Patient was admitted secondary to MS flare.  Patient has received 5/5 doses of methylprednisolone and is back to baseline  Recommendations: -At this point in time patient should make appointment to return to his primary neurologist at [facility]. -At this point neurology will sign off. Please call with any questions.   Electronically signed: Dr. Caryl Pina 06/05/2018, 10:48 AM

## 2018-06-05 NOTE — TOC Transition Note (Signed)
Transition of Care [facility]) - CM/SW Discharge Note   Patient Details  Name: Reshawn Ghattas MRN: [id number] Date of Birth: 1985/11/18  [facility] CM/SW Contact:  Kermit Balo, RN Phone Number: 06/05/2018, 12:49 PM   Clinical Narrative:       Final next level of care: OP Rehab Barriers to Discharge: No Barriers Identified   Patient Goals and CMS Choice     Choice offered to / list presented to : Patient  Discharge Placement                       Discharge Plan and Services Discharge Planning Services: CM Consult                      Social Determinants of Health (SDOH) Interventions  Pt provided bus passes to get him home.  and PART.   Readmission Risk Interventions No flowsheet data found.

## 2018-06-05 NOTE — Discharge Summary (Signed)
Physician Discharge Summary  Bob Solomon:[id number] DOB: December 08, 1985 DOA: 06/01/2018  PCP: [facility]  Admit date: 06/01/2018 Discharge date: 06/05/2018  Admitted From: home Disposition:  home  Recommendations for Outpatient Follow-up:  1. Follow up with PCP in 1 week 2. Follow up with Neurology at [facility] Bob Solomon in 1 week 3. Follow up final blood culture results, negative at time of discharge    Discharge Condition: Stable CODE STATUS: Full  Diet recommendation: Regular diet   Brief/Interim Summary: Bob Motleyis a 32 y.o.malewith medical history significant ofmultiple sclerosis, major depression, history of suicidal ideations. He presented to the ED with intermittent double vision and left leg weakness for the past 2 days,he suspects having another MS flare. He noticed over the past48hours that his left leg was dragging at work however continued to work despite this in addition also reports mild left arm weakness. He is followed by neurology at [facility] for MS, on Tysabri infusions last one was in February. MRI Brain,cervical spine, thoracic spine all with and without contrast revealed a new subcortical T2 hyperintensity is present in the right superior temporal gyrus, just posterior to the sylvian fissure, consistent with progression of the patient's multiple sclerosis. Neurology was consulted and patient received 5 days of high-dose IV solumedrol. He also received cephalosporin for 5 days for subacute sinusitis. His symptoms resolved on day of admission.   Discharge Diagnoses:  Principal Problem:   Multiple sclerosis exacerbation (HCC) Active Problems:   MDD (major depressive disorder)   Subacute sinusitis   ADHD   Vitamin D deficiency    Discharge Instructions  Discharge Instructions    Ambulatory referral to Occupational Therapy   Complete by:  As directed    Call MD for:  difficulty breathing, headache or visual  disturbances   Complete by:  As directed    Call MD for:  extreme fatigue   Complete by:  As directed    Call MD for:  persistant dizziness or light-headedness   Complete by:  As directed    Call MD for:  persistant nausea and vomiting   Complete by:  As directed    Call MD for:  severe uncontrolled pain   Complete by:  As directed    Diet general   Complete by:  As directed    Discharge instructions   Complete by:  As directed    You were cared for by a hospitalist during your hospital stay. If you have any questions about your discharge medications or the care you received while you were in the hospital after you are discharged, you can call the unit and ask to speak with the hospitalist on call if the hospitalist that took care of you is not available. Once you are discharged, your primary care physician will handle any further medical issues. Please note that NO REFILLS for any discharge medications will be authorized once you are discharged, as it is imperative that you return to your primary care physician (or establish a relationship with a primary care physician if you do not have one) for your aftercare needs so that they can reassess your need for medications and monitor your lab values.   Increase activity slowly   Complete by:  As directed      Allergies as of 06/05/2018      Reactions   Shellfish Allergy Anaphylaxis   Penicillins Rash   DID THE REACTION INVOLVE: Swelling of the face/tongue/throat, SOB, or low BP? yes  Sudden or severe rash/hives, skin peeling, or the inside of the mouth or nose? yes Did it require medical treatment? yes When did it last happen?2009/2010 If all above answers are "NO", may proceed with cephalosporin use.      Medication List    TAKE these medications   amphetamine-dextroamphetamine 30 MG 24 hr capsule Commonly known as:  ADDERALL XR Take 30 mg by mouth daily.   ARIPiprazole 5 MG tablet Commonly known as:  ABILIFY Take 1 tablet (5  mg total) by mouth daily. For mood   DULoxetine HCl 40 MG Cpep Take 40 mg by mouth daily. For mood   HYDROcodone-acetaminophen 5-325 MG tablet Commonly known as:  NORCO/VICODIN Take 1 tablet by mouth every 6 (six) hours as needed for up to 5 days for moderate pain or severe pain.   hydrOXYzine 25 MG tablet Commonly known as:  ATARAX/VISTARIL Take 1 tablet (25 mg total) by mouth every 6 (six) hours as needed for anxiety.   nicotine 21 mg/24hr patch Commonly known as:  NICODERM CQ - dosed in mg/24 hours Place 1 patch (21 mg total) onto the skin daily. (May buy over the counter)   pregabalin 75 MG capsule Commonly known as:  LYRICA Take 1 capsule (75 mg total) by mouth 2 (two) times daily. For pain   traZODone 50 MG tablet Commonly known as:  DESYREL Take 1 tablet (50 mg total) by mouth at bedtime as needed for sleep.   vitamin B-12 1000 MCG tablet Commonly known as:  CYANOCOBALAMIN Take 1,000 mcg by mouth daily.      Follow-up Information    [facility]. Schedule an appointment as soon as possible for a visit in 1 week(s).   Specialty:  Family Medicine       Bob Solomon. Schedule an appointment as soon as possible for a visit in 1 week(s).   Specialty:  Unknown Physician Specialty Why:  Neurology follow up        [facility] Follow up.   Why:  They will contact you for the first appointment Contact information: [phone]         Allergies  Allergen Reactions  . Shellfish Allergy Anaphylaxis  . Penicillins Rash    DID THE REACTION INVOLVE: Swelling of the face/tongue/throat, SOB, or low BP? yes Sudden or severe rash/hives, skin peeling, or the inside of the mouth or nose? yes Did it require medical treatment? yes When did it last happen?2009/2010 If all above answers are "NO", may proceed with cephalosporin use.     Consultations:  Neurology    Procedures/Studies: Dg Chest 2  View  Result Date: 06/01/2018 CLINICAL DATA:  Multiple sclerosis, fall, cough EXAM: CHEST - 2 VIEW COMPARISON:  09/17/2011. FINDINGS: The heart size and mediastinal contours are within normal limits. Both lungs are clear. The visualized skeletal structures are unremarkable. IMPRESSION: No acute abnormality of the lungs.  No focal airspace opacity. Electronically Signed   By: Lauralyn Primes M.D.   On: 06/01/2018 10:13   Mr Brain W And Wo Contrast  Result Date: 06/01/2018 CLINICAL DATA:  Multiple sclerosis.  New neurologic event. EXAM: MRI HEAD WITHOUT AND WITH CONTRAST TECHNIQUE: Multiplanar, multiecho pulse sequences of the brain and surrounding structures were obtained without and with intravenous contrast. CONTRAST:  8 mL Gadavist COMPARISON:  MRI brain 02/10/2018 FINDINGS: Brain: A new subcortical T2 hyperintensity is present in the right superior temporal gyrus, just posterior to the sylvian fissure. This is best visualized  on image 13 of series 11 and image 55 of series 17. There is no associated enhancement or restricted diffusion with this lesion. Extensive periventricular and subcortical T2 signal changes are present otherwise. There is diffuse involvement of the callososeptal margin. No other new lesions are present. There is no other restricted diffusion or focal enhancement. Areas of enhancement noted on the prior study are no longer present. Volume loss is present. The ventricles are of proportionate to the degree of atrophy. No significant extraaxial fluid collection is present. Postcontrast images demonstrate no pathologic enhancement. The internal auditory canals are within normal limits. The brainstem and cerebellum are within normal limits. Vascular: Flow is present in the major intracranial arteries. Skull and upper cervical spine: The craniocervical junction is normal. Upper cervical spine is within normal limits. Marrow signal is unremarkable. Sinuses/Orbits: Progressive sinus disease is  present. A fluid level is present in the right maxillary sinus with associated circumferential thickening. There is diffuse opacification of ethmoid air cells, right greater than left. Bilateral mastoid effusions are present. No obstructing nasopharyngeal lesion is present. The globes and orbits are within normal limits. IMPRESSION: 1. New subcortical T2 hyperintensity involving the right superior temporal gyrus just posterior to the sylvian fissure measures up to 18 mm. This is consistent with progression of the patient's multiple sclerosis. 2. No enhancement or restricted diffusion to suggest other active demyelination. 3. Extensive periventricular and subcortical white matter disease is otherwise stable, consistent with the given diagnosis of multiple sclerosis. 4. Associated atrophy. 5. New sinus disease as described. Electronically Signed   By: Marin Roberts M.D.   On: 06/01/2018 13:39   Mr Cervical Spine W Or Wo Contrast  Result Date: 06/01/2018 CLINICAL DATA:  Multiple sclerosis.  New neurologic event. EXAM: MRI CERVICAL SPINE WITHOUT AND WITH CONTRAST TECHNIQUE: Multiplanar and multiecho pulse sequences of the cervical spine, to include the craniocervical junction and cervicothoracic junction, were obtained without and with intravenous contrast. CONTRAST:  8 mL Gadavist COMPARISON:  MRI cervical spine without and with contrast 02/10/2018 FINDINGS: Alignment: AP alignment is anatomic. There straightening of the normal cervical lordosis. Vertebrae: Marrow signal and vertebral body heights are normal. Cord: Abnormal signal in the right lateral aspect of the cord posteriorly at C2-3 and left lateral aspect of the cord posteriorly at C5 is stable from the prior exam. There is no enhancement associated with either of these areas. The more superior area was obscured on the prior study due to patient motion, but likely present. No new lesions are present in the cervical spine. Posterior Fossa, vertebral  arteries, paraspinal tissues: Craniocervical junction is normal. Flow is present in the vertebral arteries bilaterally. Visualized intracranial contents are normal. Disc levels: No focal disc protrusion or stenosis is present. Foramina are patent bilaterally. IMPRESSION: 1. Stable lateral cord T2 hyperintensity consistent with demyelination at C2-3 on the right and C5 on the left. 2. No evidence for progression of disease in the cervical spinal cord. Electronically Signed   By: Marin Roberts M.D.   On: 06/01/2018 13:44   Mr Thoracic Spine W Wo Contrast  Result Date: 06/01/2018 CLINICAL DATA:  Multiple sclerosis, new neurologic event. EXAM: MRI THORACIC WITHOUT AND WITH CONTRAST TECHNIQUE: Multiplanar and multiecho pulse sequences of the thoracic spine were obtained without and with intravenous contrast. CONTRAST:  8 mL Gadavist COMPARISON:  MRI of the thoracic spine without and with contrast 02/10/2018 FINDINGS: MRI THORACIC SPINE FINDINGS Alignment: AP alignment is anatomic. Rightward curvature is centered at T8. Vertebrae: Marrow  signal vertebral body heights are normal. Cord: Subtle T2 hyperintense lesion is present laterally on the right in the posterior cord at T7, unchanged. No other focal cord lesions are present. There is no expansion of cord. No pathologic enhancement is present. Paraspinal and other soft tissues: Paraspinous soft tissues are within normal limits. Visualized lung fields are clear. Limited imaging of the upper abdomen is unremarkable. Disc levels: No focal disc protrusion or stenosis is present. Foramina are patent bilaterally. IMPRESSION: 1. Stable subtle posterior cord lesion at T7 on the right. 2. No new lesions or pathologic enhancement to suggest progression of the demyelinating process in the thoracic spine. 3. No focal disc disease or stenosis. Electronically Signed   By: Marin Roberts M.D.   On: 06/01/2018 13:57      Discharge Exam: Vitals:   06/05/18 0900  06/05/18 1158  BP: 119/88 131/85  Pulse: 62 86  Resp: 18 18  Temp: 97.8 F (36.6 C)   SpO2: 96%     General: Pt is alert, awake, not in acute distress Cardiovascular: RRR, S1/S2 +, no rubs, no gallops Respiratory: CTA bilaterally, no wheezing, no rhonchi Abdominal: Soft, NT, ND, bowel sounds + Extremities: no edema, no cyanosis, strength equal bilaterally     The results of significant diagnostics from this hospitalization (including imaging, microbiology, ancillary and laboratory) are listed below for reference.     Microbiology: Recent Results (from the past 240 hour(s))  Culture, blood (routine x 2)     Status: None (Preliminary result)   Collection Time: 06/01/18 11:30 PM  Result Value Ref Range Status   Specimen Description BLOOD LEFT ANTECUBITAL  Final   Special Requests   Final    BOTTLES DRAWN AEROBIC AND ANAEROBIC Blood Culture adequate volume   Culture   Final    NO GROWTH 3 DAYS Performed at [facility], [address], [place], [place] [address]    Report Status PENDING  Incomplete  Culture, blood (routine x 2)     Status: None (Preliminary result)   Collection Time: 06/01/18 11:37 PM  Result Value Ref Range Status   Specimen Description BLOOD RIGHT HAND  Final   Special Requests   Final    BOTTLES DRAWN AEROBIC AND ANAEROBIC Blood Culture results may not be optimal due to an excessive volume of blood received in culture bottles   Culture   Final    NO GROWTH 3 DAYS Performed at [facility], [address], [place], [place] [address]    Report Status PENDING  Incomplete     Labs: BNP (last 3 results) No results for input(s): BNP in the last 8760 hours. Basic Metabolic Panel: Recent Labs  Lab 06/01/18 0359 06/01/18 1405 06/02/18 0509  NA 134*  --  136  K 4.1  --  4.4  CL 108  --  106  CO2 20*  --  22  GLUCOSE 115*  --  125*  BUN 14  --  8  CREATININE 0.99 0.97 1.01  CALCIUM 8.7*  --  8.6*   Liver Function Tests: Recent Labs   Lab 06/01/18 0359  AST 23  ALT 14  ALKPHOS 76  BILITOT 0.6  PROT 6.5  ALBUMIN 3.3*   No results for input(s): LIPASE, AMYLASE in the last 168 hours. No results for input(s): AMMONIA in the last 168 hours. CBC: Recent Labs  Lab 06/01/18 0359 06/01/18 1405 06/02/18 0509  WBC 8.2 7.1 10.5  NEUTROABS 4.8  --   --  HGB 14.0 13.4 14.2  HCT 41.6 41.2 43.8  MCV 93.3 93.6 93.6  PLT 252 226 246   Cardiac Enzymes: No results for input(s): CKTOTAL, CKMB, CKMBINDEX, TROPONINI in the last 168 hours. BNP: Invalid input(s): POCBNP CBG: No results for input(s): GLUCAP in the last 168 hours. D-Dimer No results for input(s): DDIMER in the last 72 hours. Hgb A1c No results for input(s): HGBA1C in the last 72 hours. Lipid Profile No results for input(s): CHOL, HDL, LDLCALC, TRIG, CHOLHDL, LDLDIRECT in the last 72 hours. Thyroid function studies No results for input(s): TSH, T4TOTAL, T3FREE, THYROIDAB in the last 72 hours.  Invalid input(s): FREET3 Anemia work up No results for input(s): VITAMINB12, FOLATE, FERRITIN, TIBC, IRON, RETICCTPCT in the last 72 hours. Urinalysis    Component Value Date/Time   COLORURINE YELLOW 06/01/2018 1324   APPEARANCEUR CLEAR 06/01/2018 1324   LABSPEC 1.027 06/01/2018 1324   PHURINE 6.0 06/01/2018 1324   GLUCOSEU NEGATIVE 06/01/2018 1324   HGBUR NEGATIVE 06/01/2018 1324   BILIRUBINUR NEGATIVE 06/01/2018 1324   KETONESUR NEGATIVE 06/01/2018 1324   PROTEINUR NEGATIVE 06/01/2018 1324   NITRITE NEGATIVE 06/01/2018 1324   LEUKOCYTESUR NEGATIVE 06/01/2018 1324   Sepsis Labs Invalid input(s): PROCALCITONIN,  WBC,  LACTICIDVEN Microbiology Recent Results (from the past 240 hour(s))  Culture, blood (routine x 2)     Status: None (Preliminary result)   Collection Time: 06/01/18 11:30 PM  Result Value Ref Range Status   Specimen Description BLOOD LEFT ANTECUBITAL  Final   Special Requests   Final    BOTTLES DRAWN AEROBIC AND ANAEROBIC Blood Culture  adequate volume   Culture   Final    NO GROWTH 3 DAYS Performed at [facility], [address], [place], [place] [address]    Report Status PENDING  Incomplete  Culture, blood (routine x 2)     Status: None (Preliminary result)   Collection Time: 06/01/18 11:37 PM  Result Value Ref Range Status   Specimen Description BLOOD RIGHT HAND  Final   Special Requests   Final    BOTTLES DRAWN AEROBIC AND ANAEROBIC Blood Culture results may not be optimal due to an excessive volume of blood received in culture bottles   Culture   Final    NO GROWTH 3 DAYS Performed at [facility], [address], [place], [place] [address]    Report Status PENDING  Incomplete     Patient was seen and examined on the day of discharge and was found to be in stable condition. Time coordinating discharge: 35 minutes including assessment and coordination of care, as well as examination of the patient.   SIGNED:  Noralee Stain, DO [facility] [email or web address] 06/05/2018, 2:24 PM

## 2018-06-05 NOTE — Progress Notes (Signed)
NURSING PROGRESS NOTE  Bob Solomon [id number] Discharge Data: 06/05/2018 1:27 PM Attending Provider: Noralee Stain, DO BOF:[id number], [facility]     Bob Solomon to be D/C'd Home per MD order.  Discussed with the patient the After Visit Summary and all questions fully answered. All IV's discontinued with no bleeding noted. All belongings returned to patient for patient to take home.   Last Vital Signs:  Blood pressure 131/85, pulse 86, temperature 97.8 F (36.6 C), temperature source Oral, resp. rate 18, height 6' (1.829 m), SpO2 96 %.  Discharge Medication List Allergies as of 06/05/2018      Reactions   Shellfish Allergy Anaphylaxis   Penicillins Rash   DID THE REACTION INVOLVE: Swelling of the face/tongue/throat, SOB, or low BP? yes Sudden or severe rash/hives, skin peeling, or the inside of the mouth or nose? yes Did it require medical treatment? yes When did it last happen?2009/2010 If all above answers are "NO", may proceed with cephalosporin use.      Medication List    TAKE these medications   amphetamine-dextroamphetamine 30 MG 24 hr capsule Commonly known as:  ADDERALL XR Take 30 mg by mouth daily.   ARIPiprazole 5 MG tablet Commonly known as:  ABILIFY Take 1 tablet (5 mg total) by mouth daily. For mood   DULoxetine HCl 40 MG Cpep Take 40 mg by mouth daily. For mood   HYDROcodone-acetaminophen 5-325 MG tablet Commonly known as:  NORCO/VICODIN Take 1 tablet by mouth every 6 (six) hours as needed for up to 5 days for moderate pain or severe pain.   hydrOXYzine 25 MG tablet Commonly known as:  ATARAX/VISTARIL Take 1 tablet (25 mg total) by mouth every 6 (six) hours as needed for anxiety.   nicotine 21 mg/24hr patch Commonly known as:  NICODERM CQ - dosed in mg/24 hours Place 1 patch (21 mg total) onto the skin daily. (May buy over the counter)   pregabalin 75 MG capsule Commonly known as:  LYRICA Take 1 capsule (75 mg total) by  mouth 2 (two) times daily. For pain   traZODone 50 MG tablet Commonly known as:  DESYREL Take 1 tablet (50 mg total) by mouth at bedtime as needed for sleep.   vitamin B-12 1000 MCG tablet Commonly known as:  CYANOCOBALAMIN Take 1,000 mcg by mouth daily.

## 2018-06-07 LAB — CULTURE, BLOOD (ROUTINE X 2)
Culture: NO GROWTH
Culture: NO GROWTH
Special Requests: ADEQUATE

## 2019-03-17 ENCOUNTER — Encounter (HOSPITAL_COMMUNITY): Payer: Self-pay

## 2019-03-17 ENCOUNTER — Ambulatory Visit (HOSPITAL_COMMUNITY)
Admission: EM | Admit: 2019-03-17 | Discharge: 2019-03-17 | Disposition: A | Discharge status: Home / Self Care | Payer: Medicaid Other | Attending: Family Medicine | Admitting: Family Medicine

## 2019-03-17 ENCOUNTER — Other Ambulatory Visit: Payer: Self-pay

## 2019-03-17 DIAGNOSIS — F909 Attention-deficit hyperactivity disorder, unspecified type: Secondary | ICD-10-CM | POA: Diagnosis not present

## 2019-03-17 DIAGNOSIS — Z20828 Contact with and (suspected) exposure to other viral communicable diseases: Secondary | ICD-10-CM | POA: Diagnosis not present

## 2019-03-17 DIAGNOSIS — B349 Viral infection, unspecified: Secondary | ICD-10-CM | POA: Insufficient documentation

## 2019-03-17 DIAGNOSIS — Z833 Family history of diabetes mellitus: Secondary | ICD-10-CM | POA: Insufficient documentation

## 2019-03-17 DIAGNOSIS — F17210 Nicotine dependence, cigarettes, uncomplicated: Secondary | ICD-10-CM | POA: Insufficient documentation

## 2019-03-17 DIAGNOSIS — R634 Abnormal weight loss: Secondary | ICD-10-CM | POA: Diagnosis present

## 2019-03-17 DIAGNOSIS — G35 Multiple sclerosis: Secondary | ICD-10-CM | POA: Insufficient documentation

## 2019-03-17 DIAGNOSIS — F329 Major depressive disorder, single episode, unspecified: Secondary | ICD-10-CM | POA: Insufficient documentation

## 2019-03-17 DIAGNOSIS — Z91013 Allergy to seafood: Secondary | ICD-10-CM | POA: Insufficient documentation

## 2019-03-17 DIAGNOSIS — Z880 Allergy status to penicillin: Secondary | ICD-10-CM | POA: Insufficient documentation

## 2019-03-17 DIAGNOSIS — Z79899 Other long term (current) drug therapy: Secondary | ICD-10-CM | POA: Insufficient documentation

## 2019-03-17 DIAGNOSIS — Z6821 Body mass index (BMI) 21.0-21.9, adult: Secondary | ICD-10-CM | POA: Diagnosis not present

## 2019-03-17 MED ORDER — ONDANSETRON HCL 4 MG PO TABS: 4.0000 mg | ORAL_TABLET | Freq: Three times a day (TID) | ORAL | 0 refills | Status: AC | PRN

## 2019-03-17 NOTE — ED Triage Notes (Addendum)
Pt. States 2 days now has not eaten & fatigue. Also clarifies that he has NOT been vomiting just happened one time. Wants COVID testing, he has MS.

## 2019-03-17 NOTE — Discharge Instructions (Addendum)
For your concern of weight loss, please continue to follow up with your Neurologist or your primary care provider.  You may take tylenol for chills and body aches, 2 x 325mg  Tablets every 6 hours.  If you become short of breath, have severe vomiting, diarrhea or chest pain, please go to the Emergency Department.  In the event you are Covid positive, please call your Primary care for close follow up   If your Covid-19 test is positive, you will receive a phone call from [facility] regarding your results. Negative test results are not called. Both positive and negative results area always visible on MyChart. If you do not have a MyChart account, sign up instructions are in your discharge papers.   Persons who are directed to care for themselves at home may discontinue isolation under the following conditions:   At least 10 days have passed since symptom onset and  At least 24 hours have passed without running a fever (this means without the use of fever-reducing medications) and  Other symptoms have improved.  Persons infected with COVID-19 who never develop symptoms may discontinue isolation and other precautions 10 days after the date of their first positive COVID-19 test.

## 2019-03-17 NOTE — ED Provider Notes (Signed)
[place]    CSN: [id number] Arrival date & time: 03/17/19  1335      History   Chief Complaint Chief Complaint  Patient presents with  . Fatigue    HPI Bob Solomon is a 33 y.o. male.   Patient reports to urgent care today for 2 day history of fatigue, 1 episode of vomiting, nausea and loss of appetite. Patient reports a sudden onset of nausea and vomiting while at work on Sunday. He has since only had nausea and a loss of appetite. He also reports significant fatigue. He reports a possible loss of smell or taste on Sunday as well. He reports a fever at home and does endorse a slight cough. He denies congestion, runny nose, headache. He denies shortness of breath or chest pain. He has not taken anything to help his symptoms.   He was sent home from work and requires a covid test.   He has been able to eat snacks and drink plenty of water. He reports a change in his MS infusions and endorses significant weight loss since that time. He reports he has called his neurologist about this and is waiting for a response.      Past Medical History:  Diagnosis Date  . MS (multiple sclerosis) [facility])     Patient Active Problem List   Diagnosis Date Noted  . Subacute sinusitis 06/05/2018  . ADHD 06/05/2018  . Vitamin D deficiency 06/05/2018  . MDD (major depressive disorder) 03/20/2018  . Multiple sclerosis exacerbation ([place]) 02/10/2018    Past Surgical History:  Procedure Laterality Date  . KNEE SURGERY         Home Medications    Prior to Admission medications   Medication Sig Start Date End Date Taking? Authorizing Provider  amphetamine-dextroamphetamine (ADDERALL XR) 30 MG 24 hr capsule Take 30 mg by mouth daily. 05/21/18   [provider]  ARIPiprazole (ABILIFY) 5 MG tablet Take 1 tablet (5 mg total) by mouth daily. For mood 04/23/18   Connye Burkitt, NP  DULoxetine 40 MG CPEP Take 40 mg by mouth daily. For mood 04/23/18   Connye Burkitt, NP    hydrOXYzine (ATARAX/VISTARIL) 25 MG tablet Take 1 tablet (25 mg total) by mouth every 6 (six) hours as needed for anxiety. 04/22/18   Connye Burkitt, NP  nicotine (NICODERM CQ - DOSED IN MG/24 HOURS) 21 mg/24hr patch Place 1 patch (21 mg total) onto the skin daily. (May buy over the counter) Patient not taking: Reported on 06/01/2018 04/23/18   Connye Burkitt, NP  ondansetron (ZOFRAN) 4 MG tablet Take 1 tablet (4 mg total) by mouth every 8 (eight) hours as needed for nausea or vomiting. 03/17/19   Dehlia Kilner, Marguerita Beards, PA-C  pregabalin (LYRICA) 75 MG capsule Take 1 capsule (75 mg total) by mouth 2 (two) times daily. For pain 04/22/18   Connye Burkitt, NP  traZODone (DESYREL) 50 MG tablet Take 1 tablet (50 mg total) by mouth at bedtime as needed for sleep. 04/22/18   Connye Burkitt, NP  vitamin B-12 (CYANOCOBALAMIN) 1000 MCG tablet Take 1,000 mcg by mouth daily.    [provider]    Family History Family History  Problem Relation Age of Onset  . Diabetes Mother   . Healthy Father     Social History Social History   Tobacco Use  . Smoking status: Current Every Day Smoker    Packs/day: 0.50    Years: 15.00  Pack years: 7.50    Types: Cigarettes  . Smokeless tobacco: Never Used  . Tobacco comment: "about 3 per day" 02/17/2018  Substance Use Topics  . Alcohol use: Yes    Comment: Social  . Drug use: Yes    Types: Marijuana     Allergies   Shellfish allergy and Penicillins   Review of Systems Review of Systems  Constitutional: Positive for activity change, appetite change, chills, fatigue and fever.  HENT: Negative for congestion, ear pain, postnasal drip, rhinorrhea, sinus pressure and sore throat.   Eyes: Negative for pain and visual disturbance.  Respiratory: Positive for cough. Negative for shortness of breath.   Cardiovascular: Negative for chest pain and palpitations.  Gastrointestinal: Positive for nausea and vomiting. Negative for abdominal pain and diarrhea.   Endocrine: Negative for polyuria.  Genitourinary: Negative for dysuria.  Musculoskeletal: Negative for arthralgias and back pain.  Skin: Negative for color change and rash.  Neurological: Negative for syncope, weakness and headaches.  All other systems reviewed and are negative.    Physical Exam Triage Vital Signs ED Triage Vitals  Enc Vitals Group     BP 03/17/19 1446 133/75     Pulse Rate 03/17/19 1446 76     Resp 03/17/19 1446 17     Temp 03/17/19 1446 98.2 F (36.8 C)     Temp Source 03/17/19 1446 Oral     SpO2 03/17/19 1446 100 %     Weight 03/17/19 1443 160 lb (72.6 kg)     Height --      Head Circumference --      Peak Flow --      Pain Score 03/17/19 1441 7     Pain Loc --      Pain Edu? --      Excl. in GC? --    No data found.  Updated Vital Signs BP 133/75 (BP Location: Right Arm)   Pulse 76   Temp 98.2 F (36.8 C) (Oral)   Resp 17   Wt 160 lb (72.6 kg)   SpO2 100%   BMI 21.70 kg/m   Visual Acuity Right Eye Distance:   Left Eye Distance:   Bilateral Distance:    Right Eye Near:   Left Eye Near:    Bilateral Near:     Physical Exam Vitals and nursing note reviewed.  Constitutional:      General: He is not in acute distress.    Appearance: Normal appearance. He is well-developed and normal weight. He is not ill-appearing.  HENT:     Head: Normocephalic and atraumatic.     Right Ear: External ear normal.     Left Ear: External ear normal.     Nose: Nose normal. No congestion or rhinorrhea.     Mouth/Throat:     Mouth: Mucous membranes are moist.     Pharynx: Oropharynx is clear. No oropharyngeal exudate or posterior oropharyngeal erythema.  Eyes:     General: No scleral icterus.    Conjunctiva/sclera: Conjunctivae normal.  Cardiovascular:     Rate and Rhythm: Normal rate and regular rhythm.     Pulses: Normal pulses.     Heart sounds: No murmur. No friction rub. No gallop.   Pulmonary:     Effort: Pulmonary effort is normal. No  respiratory distress.     Breath sounds: Normal breath sounds. No stridor. No wheezing or rales.  Abdominal:     General: Bowel sounds are normal.     Palpations: Abdomen  is soft. There is no mass.     Tenderness: There is no abdominal tenderness.  Musculoskeletal:     Cervical back: Neck supple.     Right lower leg: No edema.  Lymphadenopathy:     Cervical: No cervical adenopathy.  Skin:    General: Skin is warm and dry.  Neurological:     General: No focal deficit present.     Mental Status: He is alert and oriented to person, place, and time.  Psychiatric:        Mood and Affect: Mood normal.        Behavior: Behavior normal.        Thought Content: Thought content normal.        Judgment: Judgment normal.      UC Treatments / Results  Labs (all labs ordered are listed, but only abnormal results are displayed) Labs Reviewed  NOVEL CORONAVIRUS, NAA (HOSP ORDER, SEND-OUT TO REF LAB; TAT 18-24 HRS)    EKG   Radiology No results found.  Procedures Procedures (including critical care time)  Medications Ordered in UC Medications - No data to display  Initial Impression / Assessment and Plan / UC Course  I have reviewed the triage vital signs and the nursing notes.  Pertinent labs & imaging results that were available during my care of the patient were reviewed by me and considered in my medical decision making (see chart for details).     #viral illness - Unclear if primarily GI or upper respiratory with cough presence. COVID PCR sent. Gave zofran to aid nausea. Recommended symptomatic treatment for now and to push fluids. Expressed need to have close follow up if Covid Positive due to MS and infusions. Patient understands.   Final Clinical Impressions(s) / UC Diagnoses   Final diagnoses:  Viral illness  Loss of weight     Discharge Instructions     For your concern of weight loss, please continue to follow up with your Neurologist or your primary care  provider.  You may take tylenol for chills and body aches, 2 x 325mg  Tablets every 6 hours.  If you become short of breath, have severe vomiting, diarrhea or chest pain, please go to the Emergency Department.  In the event you are Covid positive, please call your Primary care for close follow up   If your Covid-19 test is positive, you will receive a phone call from [facility] regarding your results. Negative test results are not called. Both positive and negative results area always visible on MyChart. If you do not have a MyChart account, sign up instructions are in your discharge papers.   Persons who are directed to care for themselves at home may discontinue isolation under the following conditions:  . At least 10 days have passed since symptom onset and . At least 24 hours have passed without running a fever (this means without the use of fever-reducing medications) and . Other symptoms have improved.  Persons infected with COVID-19 who never develop symptoms may discontinue isolation and other precautions 10 days after the date of their first positive COVID-19 test.     ED Prescriptions    Medication Sig Dispense Auth. Provider   ondansetron (ZOFRAN) 4 MG tablet Take 1 tablet (4 mg total) by mouth every 8 (eight) hours as needed for nausea or vomiting. 4 tablet Griffey Nicasio, Veryl Speak, PA-C     PDMP not reviewed this encounter.   Hermelinda Medicus, PA-C 03/17/19 1600

## 2019-03-19 LAB — NOVEL CORONAVIRUS, NAA (HOSP ORDER, SEND-OUT TO REF LAB; TAT 18-24 HRS): SARS-CoV-2, NAA: NOT DETECTED

## 2020-01-26 ENCOUNTER — Encounter (HOSPITAL_COMMUNITY): Payer: Self-pay

## 2020-01-26 ENCOUNTER — Other Ambulatory Visit: Payer: Self-pay

## 2020-01-26 ENCOUNTER — Ambulatory Visit (HOSPITAL_COMMUNITY)
Admission: EM | Admit: 2020-01-26 | Discharge: 2020-01-26 | Disposition: A | Discharge status: Home / Self Care | Payer: Medicaid Other | Attending: Family Medicine | Admitting: Family Medicine

## 2020-01-26 DIAGNOSIS — M79642 Pain in left hand: Secondary | ICD-10-CM | POA: Diagnosis not present

## 2020-01-26 DIAGNOSIS — L03019 Cellulitis of unspecified finger: Secondary | ICD-10-CM | POA: Diagnosis not present

## 2020-01-26 MED ORDER — MELOXICAM 15 MG PO TABS: 15.0000 mg | ORAL_TABLET | Freq: Every day | ORAL | 0 refills | Status: AC | PRN

## 2020-01-26 MED ORDER — CYCLOBENZAPRINE HCL 10 MG PO TABS: 10.0000 mg | ORAL_TABLET | Freq: Three times a day (TID) | ORAL | 0 refills | Status: AC | PRN

## 2020-01-26 MED ORDER — KETOROLAC TROMETHAMINE 60 MG/2ML IM SOLN
60.0000 mg | Freq: Once | INTRAMUSCULAR | Status: AC
  Administered 2020-01-26: 60 mg via INTRAMUSCULAR

## 2020-01-26 MED ORDER — KETOROLAC TROMETHAMINE 60 MG/2ML IM SOLN
INTRAMUSCULAR | Status: AC
  Filled 2020-01-26: qty 2

## 2020-01-26 MED ORDER — DOXYCYCLINE HYCLATE 100 MG PO TABS: 100.0000 mg | ORAL_TABLET | Freq: Two times a day (BID) | ORAL | 0 refills | Status: AC

## 2020-01-26 NOTE — ED Provider Notes (Signed)
[facility]    CSN: [id number] Arrival date & time: 01/26/20  1353      History   Chief Complaint Chief Complaint  Patient presents with  . Hand Pain    HPI Bob Solomon is a 34 y.o. male.   Patient presenting today with 1 day hx of left hand swelling, sharp pain radiating up toward elbow, and stiffness. No known injury, insect bite, fever, chills, discoloration, numbness or tingling. Of note, does have a cut to left middle finger extending through cuticle that is edematous, mildly erythematous. Not actively draining. He states he cut himself with a knife at work the other day and hasn't thought much about it since. He states he is UTD on tdap but does not remember when he last had it. Has tried icing the hand which took the swelling away but has not taken anything for the pain. Does have a hx of MS which is well controlled and followed closely by Neurology.      Past Medical History:  Diagnosis Date  . MS (multiple sclerosis) [facility])     Patient Active Problem List   Diagnosis Date Noted  . Subacute sinusitis 06/05/2018  . ADHD 06/05/2018  . Vitamin D deficiency 06/05/2018  . MDD (major depressive disorder) 03/20/2018  . Multiple sclerosis exacerbation ([facility]) 02/10/2018    Past Surgical History:  Procedure Laterality Date  . KNEE SURGERY         Home Medications    Prior to Admission medications   Medication Sig Start Date End Date Taking? Authorizing Provider  amphetamine-dextroamphetamine (ADDERALL XR) 30 MG 24 hr capsule Take 30 mg by mouth daily. 05/21/18  Yes [provider]  ARIPiprazole (ABILIFY) 5 MG tablet Take 1 tablet (5 mg total) by mouth daily. For mood 04/23/18   Aldean Baker, NP  cyclobenzaprine (FLEXERIL) 10 MG tablet Take 1 tablet (10 mg total) by mouth 3 (three) times daily as needed for muscle spasms. DO NOT DRINK ALCOHOL OR DRIVE WHILE TAKING THIS MEDICATION 01/26/20   Particia Nearing, PA-C  doxycycline (VIBRA-TABS) 100  MG tablet Take 1 tablet (100 mg total) by mouth 2 (two) times daily. 01/26/20   Particia Nearing, PA-C  DULoxetine 40 MG CPEP Take 40 mg by mouth daily. For mood 04/23/18   Aldean Baker, NP  hydrOXYzine (ATARAX/VISTARIL) 25 MG tablet Take 1 tablet (25 mg total) by mouth every 6 (six) hours as needed for anxiety. 04/22/18   Aldean Baker, NP  meloxicam (MOBIC) 15 MG tablet Take 1 tablet (15 mg total) by mouth daily as needed for pain. 01/26/20   Particia Nearing, PA-C  nicotine (NICODERM CQ - DOSED IN MG/24 HOURS) 21 mg/24hr patch Place 1 patch (21 mg total) onto the skin daily. (May buy over the counter) Patient not taking: Reported on 06/01/2018 04/23/18   Aldean Baker, NP  ondansetron (ZOFRAN) 4 MG tablet Take 1 tablet (4 mg total) by mouth every 8 (eight) hours as needed for nausea or vomiting. 03/17/19   Darr, Gerilyn Pilgrim, PA-C  pregabalin (LYRICA) 75 MG capsule Take 1 capsule (75 mg total) by mouth 2 (two) times daily. For pain 04/22/18   Aldean Baker, NP  traZODone (DESYREL) 50 MG tablet Take 1 tablet (50 mg total) by mouth at bedtime as needed for sleep. 04/22/18   Aldean Baker, NP  vitamin B-12 (CYANOCOBALAMIN) 1000 MCG tablet Take 1,000 mcg by mouth daily.    [provider]  Family History Family History  Problem Relation Age of Onset  . Diabetes Mother     Social History Social History   Tobacco Use  . Smoking status: Current Every Day Smoker    Packs/day: 0.50    Years: 15.00    Pack years: 7.50    Types: Cigarettes  . Smokeless tobacco: Never Used  . Tobacco comment: "about 3 per day" 02/17/2018  Vaping Use  . Vaping Use: Never used  Substance Use Topics  . Alcohol use: Yes    Comment: Social  . Drug use: Yes    Types: Marijuana     Allergies   Shellfish allergy and Penicillins   Review of Systems Review of Systems PER HPI    Physical Exam Triage Vital Signs ED Triage Vitals  Enc Vitals Group     BP 01/26/20 1457 (!) 135/94     Pulse  Rate 01/26/20 1455 77     Resp 01/26/20 1455 19     Temp 01/26/20 1455 98.6 F (37 C)     Temp Source 01/26/20 1455 Oral     SpO2 01/26/20 1455 97 %     Weight --      Height --      Head Circumference --      Peak Flow --      Pain Score 01/26/20 1448 8     Pain Loc --      Pain Edu? --      Excl. in GC? --    No data found.  Updated Vital Signs BP (!) 135/94   Pulse 77   Temp 98.6 F (37 C) (Oral)   Resp 19   SpO2 97%   Visual Acuity Right Eye Distance:   Left Eye Distance:   Bilateral Distance:    Right Eye Near:   Left Eye Near:    Bilateral Near:     Physical Exam Vitals and nursing note reviewed.  Constitutional:      Appearance: Normal appearance.  HENT:     Head: Atraumatic.  Eyes:     Extraocular Movements: Extraocular movements intact.     Conjunctiva/sclera: Conjunctivae normal.  Cardiovascular:     Rate and Rhythm: Normal rate and regular rhythm.     Pulses: Normal pulses.     Comments: Good capillary refill left hand Pulmonary:     Effort: Pulmonary effort is normal.     Breath sounds: Normal breath sounds.  Musculoskeletal:        General: Tenderness (severe ttp left hand particularly 3rd - 5th digits extending up through wrist and forearm) present. No swelling.     Cervical back: Normal range of motion and neck supple.     Comments: Decreased ROM of left hand due to pain and stiffness  Skin:    General: Skin is warm and dry.     Comments: 1 cm laceration to left distal middle finger extending into cuticle with edema and mild erythema  Neurological:     General: No focal deficit present.     Mental Status: He is oriented to person, place, and time.     Sensory: No sensory deficit.  Psychiatric:        Mood and Affect: Mood normal.        Thought Content: Thought content normal.        Judgment: Judgment normal.      UC Treatments / Results  Labs (all labs ordered are listed, but only abnormal results are displayed)  Labs Reviewed -  No data to display  EKG   Radiology No results found.  Procedures Procedures (including critical care time)  Medications Ordered in UC Medications  ketorolac (TORADOL) injection 60 mg (60 mg Intramuscular Given 01/26/20 1547)    Initial Impression / Assessment and Plan / UC Course  I have reviewed the triage vital signs and the nursing notes.  Pertinent labs & imaging results that were available during my care of the patient were reviewed by me and considered in my medical decision making (see chart for details).     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 Also has a paronychia on left 3rd digit so will start abx to r/o developing cellulitis. IM toradol given today in clinic, sent home with muscle relaxers and meloxicam for prn use. Discussed supportive home care and strict return precautions. Work note given.   Final Clinical Impressions(s) / UC Diagnoses   Final diagnoses:  Left hand pain  Paronychia of middle finger   Discharge Instructions   None    ED Prescriptions    Medication Sig Dispense Auth. Provider   cyclobenzaprine (FLEXERIL) 10 MG tablet Take 1 tablet (10 mg total) by mouth 3 (three) times daily as needed for muscle spasms. DO NOT DRINK ALCOHOL OR DRIVE WHILE TAKING THIS MEDICATION 30 tablet Particia Nearing, PA-C   meloxicam (MOBIC) 15 MG tablet Take 1 tablet (15 mg total) by mouth daily as needed for pain. 30 tablet Particia Nearing, [place]   doxycycline (VIBRA-TABS) 100 MG tablet Take 1 tablet (100 mg total) by mouth 2 (two) times daily. 14 tablet Particia Nearing, [place]     PDMP not reviewed this encounter.   Particia Nearing, [place] 01/26/20 1644

## 2020-01-26 NOTE — ED Triage Notes (Signed)
Pt in with c/o sharp pain that radiates up his arm that started this morning. Also states that hand was swollen this morning  Pt states that he iced his hand and the swelling went down  Denies any recent injury or bruising to area   Painful to palpation, +2 radial pulse, no deformity noted

## 2020-07-18 ENCOUNTER — Other Ambulatory Visit: Payer: Self-pay

## 2020-07-18 ENCOUNTER — Ambulatory Visit (HOSPITAL_COMMUNITY)
Admission: EM | Admit: 2020-07-18 | Discharge: 2020-07-18 | Disposition: A | Discharge status: Home / Self Care | Payer: Medicaid Other | Attending: Family Medicine | Admitting: Family Medicine

## 2020-07-18 ENCOUNTER — Encounter (HOSPITAL_COMMUNITY): Payer: Self-pay

## 2020-07-18 DIAGNOSIS — J111 Influenza due to unidentified influenza virus with other respiratory manifestations: Secondary | ICD-10-CM | POA: Diagnosis not present

## 2020-07-18 MED ORDER — CHLORHEXIDINE GLUCONATE 4 % EX LIQD
CUTANEOUS | Status: AC
  Filled 2020-07-18: qty 15

## 2020-07-18 MED ORDER — BENZONATATE 100 MG PO CAPS: ORAL_CAPSULE | ORAL | 0 refills | Status: AC

## 2020-07-18 MED ORDER — OSELTAMIVIR PHOSPHATE 75 MG PO CAPS: 75.0000 mg | ORAL_CAPSULE | Freq: Two times a day (BID) | ORAL | 0 refills | Status: AC

## 2020-07-18 NOTE — Discharge Instructions (Signed)

## 2020-07-18 NOTE — ED Triage Notes (Signed)
Pt presents with c/o cough and fever for past few days

## 2020-07-20 NOTE — ED Provider Notes (Signed)
[facility]   [id number] 07/18/20 Arrival Time: 1211  ASSESSMENT & PLAN:  1. Influenza-like illness    OTC symptom care as needed.  Begin: Meds ordered this encounter  Medications  . oseltamivir (TAMIFLU) 75 MG capsule    Sig: Take 1 capsule (75 mg total) by mouth 2 (two) times daily for 5 days.    Dispense:  10 capsule    Refill:  0  . benzonatate (TESSALON) 100 MG capsule    Sig: Take 1 capsule by mouth every 8 (eight) hours for cough.    Dispense:  21 capsule    Refill:  0     Follow-up Information    [facility].   Specialty: Family Medicine Why: As needed.       [facility].   Specialty: Urgent Care Why: If worsening or failing to improve as anticipated. Contact information: [address] [place] [address] [phone]              Reviewed expectations re: course of current medical issues. Questions answered. Outlined signs and symptoms indicating need for more acute intervention. Understanding verbalized. After Visit Summary given.   SUBJECTIVE: History from: patient. Bob Solomon is a 35 y.o. male who reports "high fever" and non-prod cough over past 48 hours; significant body aches and chills. Very fatigued. Denies: difficulty breathing. Normal PO intake without n/v/d.    OBJECTIVE:  Vitals:   07/18/20 1310  BP: (!) 124/91  Pulse: (!) 107  Resp: 20  Temp: 98.8 F (37.1 C)  SpO2: 99%    Slight tachycardia noted.  General appearance: alert; no distress; appears fatigued Eyes: PERRLA; EOMI; conjunctiva normal HENT: [place]; AT; with nasal congestion Neck: supple  Lungs: speaks full sentences without difficulty; unlabored Extremities: no edema Skin: warm and dry Neurologic: normal gait Psychological: alert and cooperative; normal mood and affect   Allergies  Allergen Reactions  . Shellfish Allergy Anaphylaxis  . Penicillins Rash    DID THE REACTION  INVOLVE: Swelling of the face/tongue/throat, SOB, or low BP? yes Sudden or severe rash/hives, skin peeling, or the inside of the mouth or nose? yes Did it require medical treatment? yes When did it last happen?2009/2010 If all above answers are "NO", may proceed with cephalosporin use.     Past Medical History:  Diagnosis Date  . MS (multiple sclerosis) ([facility])    Social History   Socioeconomic History  . Marital status: Divorced    Spouse name: Not on file  . Number of children: Not on file  . Years of education: Not on file  . Highest education level: Not on file  Occupational History  . Occupation: cook  Tobacco Use  . Smoking status: Current Every Day Smoker    Packs/day: 0.50    Years: 15.00    Pack years: 7.50    Types: Cigarettes  . Smokeless tobacco: Never Used  . Tobacco comment: "about 3 per day" 02/17/2018  Vaping Use  . Vaping Use: Never used  Substance and Sexual Activity  . Alcohol use: Yes    Comment: Social  . Drug use: Yes    Types: Marijuana  . Sexual activity: Yes    Birth control/protection: Condom  Other Topics Concern  . Not on file  Social History Narrative  . Not on file   Social Determinants of Health   Financial Resource Strain: Not on file  Food Insecurity: Not on file  Transportation Needs: Not  on file  Physical Activity: Not on file  Stress: Not on file  Social Connections: Not on file  Intimate Partner Violence: Not on file   Family History  Problem Relation Age of Onset  . Diabetes Mother    Past Surgical History:  Procedure Laterality Date  . KNEE SURGERY       Mardella Layman, MD 07/20/20 [phone]

## 2020-07-22 ENCOUNTER — Other Ambulatory Visit: Payer: Self-pay

## 2020-07-22 ENCOUNTER — Emergency Department (HOSPITAL_COMMUNITY)
Admission: EM | Admit: 2020-07-22 | Discharge: 2020-07-23 | Disposition: A | Discharge status: Home / Self Care | Payer: Medicaid Other | Attending: Emergency Medicine | Admitting: Emergency Medicine

## 2020-07-22 ENCOUNTER — Emergency Department (HOSPITAL_COMMUNITY): Payer: Medicaid Other

## 2020-07-22 DIAGNOSIS — J101 Influenza due to other identified influenza virus with other respiratory manifestations: Secondary | ICD-10-CM | POA: Diagnosis not present

## 2020-07-22 DIAGNOSIS — R42 Dizziness and giddiness: Secondary | ICD-10-CM | POA: Insufficient documentation

## 2020-07-22 DIAGNOSIS — F17210 Nicotine dependence, cigarettes, uncomplicated: Secondary | ICD-10-CM | POA: Insufficient documentation

## 2020-07-22 DIAGNOSIS — Z20822 Contact with and (suspected) exposure to covid-19: Secondary | ICD-10-CM | POA: Insufficient documentation

## 2020-07-22 DIAGNOSIS — R059 Cough, unspecified: Secondary | ICD-10-CM | POA: Diagnosis present

## 2020-07-22 LAB — CBC WITH DIFFERENTIAL/PLATELET
Abs Immature Granulocytes: 0.05 10*3/uL (ref 0.00–0.07)
Basophils Absolute: 0 10*3/uL (ref 0.0–0.1)
Basophils Relative: 0 %
Eosinophils Absolute: 0 10*3/uL (ref 0.0–0.5)
Eosinophils Relative: 0 %
HCT: 39.9 % (ref 39.0–52.0)
Hemoglobin: 13.7 g/dL (ref 13.0–17.0)
Immature Granulocytes: 0 %
Lymphocytes Relative: 8 %
Lymphs Abs: 1 10*3/uL (ref 0.7–4.0)
MCH: 31.7 pg (ref 26.0–34.0)
MCHC: 34.3 g/dL (ref 30.0–36.0)
MCV: 92.4 fL (ref 80.0–100.0)
Monocytes Absolute: 1.2 10*3/uL — ABNORMAL HIGH (ref 0.1–1.0)
Monocytes Relative: 9 %
Neutro Abs: 11.2 10*3/uL — ABNORMAL HIGH (ref 1.7–7.7)
Neutrophils Relative %: 83 %
Platelets: 166 10*3/uL (ref 150–400)
RBC: 4.32 MIL/uL (ref 4.22–5.81)
RDW: 13.1 % (ref 11.5–15.5)
WBC: 13.5 10*3/uL — ABNORMAL HIGH (ref 4.0–10.5)
nRBC: 0 % (ref 0.0–0.2)

## 2020-07-22 LAB — BASIC METABOLIC PANEL
Anion gap: 9 (ref 5–15)
BUN: 6 mg/dL (ref 6–20)
CO2: 20 mmol/L — ABNORMAL LOW (ref 22–32)
Calcium: 8.4 mg/dL — ABNORMAL LOW (ref 8.9–10.3)
Chloride: 103 mmol/L (ref 98–111)
Creatinine, Ser: 0.95 mg/dL (ref 0.61–1.24)
GFR, Estimated: >60 mL/min (ref >60)
Glucose, Bld: 105 mg/dL — ABNORMAL HIGH (ref 70–99)
Potassium: 3.8 mmol/L (ref 3.5–5.1)
Sodium: 132 mmol/L — ABNORMAL LOW (ref 135–145)

## 2020-07-22 LAB — RESP PANEL BY RT-PCR (FLU A&B, COVID) ARPGX2
Influenza A by PCR: POSITIVE — AB
Influenza B by PCR: NEGATIVE
SARS Coronavirus 2 by RT PCR: NEGATIVE

## 2020-07-22 MED ORDER — ACETAMINOPHEN 500 MG PO TABS
1000.0000 mg | ORAL_TABLET | Freq: Once | ORAL | Status: AC
  Administered 2020-07-23: 1000 mg via ORAL
  Filled 2020-07-22: qty 2

## 2020-07-22 MED ORDER — SODIUM CHLORIDE 0.9 % IV BOLUS
1000.0000 mL | Freq: Once | INTRAVENOUS | Status: AC
  Administered 2020-07-23: 1000 mL via INTRAVENOUS

## 2020-07-22 MED ORDER — KETOROLAC TROMETHAMINE 30 MG/ML IJ SOLN
30.0000 mg | Freq: Once | INTRAMUSCULAR | Status: AC
  Administered 2020-07-23: 30 mg via INTRAVENOUS
  Filled 2020-07-22: qty 1

## 2020-07-22 NOTE — ED Triage Notes (Signed)
Emergency Medicine Provider Triage Evaluation Note  Bob Solomon , a 35 y.o. male  was evaluated in triage.  Pt complains of malaise. States he was dx with the flu 4 days ago. Reports today he felt lightheaded. Denies vomiting or diarrhea. Had had congestion, cough and myalgias. Pt with hx MS and is on Ocrevus.   Review of Systems  Positive: Malaise, lightheaded, congestion, cough, myalgias, fever Negative: Vomiting, diarrhea, syncope  Physical Exam  BP 117/80 (BP Location: Left Arm)   Pulse 98   Temp (!) 100.6 F (38.1 C) (Oral)   Resp 16   SpO2 90%  Gen:   Awake, no distress   HEENT:  Atraumatic  Resp:  Normal effort  Cardiac:  Normal rate  Abd:   Nondistended, nontender  MSK:   Moves extremities without difficulty  Neuro:  Speech clear   Medical Decision Making  Medically screening exam initiated at 8:12 PM.  Appropriate orders placed.  Bob Solomon was informed that the remainder of the evaluation will be completed by another provider, this initial triage assessment does not replace that evaluation, and the importance of remaining in the ED until their evaluation is complete.  Clinical Impression   MSE was initiated and I personally evaluated the patient and placed orders (if any) at  8:18 PM on July 22, 2020.  The patient appears stable so that the remainder of the MSE may be completed by another provider.    Karrie Meres, PA-C 07/22/20 2018

## 2020-07-22 NOTE — ED Provider Notes (Signed)
[facility] EMERGENCY DEPARTMENT Provider Note   CSN: [id number] Arrival date & time: 07/22/20  3704     History Chief Complaint  Patient presents with  . Influenza    Pt states that he was dx with the flu last week. States that he is not feeling better today.    Bob Solomon is a 35 y.o. male.  Patient is a 35 year old male with past medical history of multiple sclerosis receiving biannual infusions.  Patient presents today with a several day history of body aches, fever, cough, and feeling generally unwell.  He was seen at urgent care and had a positive influenza A test.  He was prescribed Tamiflu and Tessalon and discharged to home.  This evening at work, he was experiencing such body aches and dizziness that he called 911 and was transported here.  He denies to me he is having any chest pain or difficulty breathing.  He denies any abdominal pain or diarrhea.  The history is provided by the patient.  Influenza Presenting symptoms: cough and myalgias   Severity:  Severe Onset quality:  Sudden Duration:  4 days Progression:  Worsening Chronicity:  New Relieved by:  Nothing Worsened by:  Nothing Ineffective treatments: Tamiflu and Motrin. Associated symptoms: chills   Associated symptoms: no neck stiffness        Past Medical History:  Diagnosis Date  . MS (multiple sclerosis) [facility])     Patient Active Problem List   Diagnosis Date Noted  . Subacute sinusitis 06/05/2018  . ADHD 06/05/2018  . Vitamin D deficiency 06/05/2018  . MDD (major depressive disorder) 03/20/2018  . Multiple sclerosis exacerbation (HCC) 02/10/2018    Past Surgical History:  Procedure Laterality Date  . KNEE SURGERY         Family History  Problem Relation Age of Onset  . Diabetes Mother     Social History   Tobacco Use  . Smoking status: Current Every Day Smoker    Packs/day: 0.50    Years: 15.00    Pack years: 7.50    Types: Cigarettes  . Smokeless tobacco:  Never Used  . Tobacco comment: "about 3 per day" 02/17/2018  Vaping Use  . Vaping Use: Never used  Substance Use Topics  . Alcohol use: Yes    Comment: Social  . Drug use: Yes    Types: Marijuana    Home Medications Prior to Admission medications   Medication Sig Start Date End Date Taking? Authorizing Provider  amphetamine-dextroamphetamine (ADDERALL XR) 30 MG 24 hr capsule Take 30 mg by mouth daily. 05/21/18   [provider]  ARIPiprazole (ABILIFY) 5 MG tablet Take 1 tablet (5 mg total) by mouth daily. For mood 04/23/18   Aldean Baker, NP  benzonatate (TESSALON) 100 MG capsule Take 1 capsule by mouth every 8 (eight) hours for cough. 07/18/20   Mardella Layman, MD  cyclobenzaprine (FLEXERIL) 10 MG tablet Take 1 tablet (10 mg total) by mouth 3 (three) times daily as needed for muscle spasms. DO NOT DRINK ALCOHOL OR DRIVE WHILE TAKING THIS MEDICATION 01/26/20   Particia Nearing, PA-C  doxycycline (VIBRA-TABS) 100 MG tablet Take 1 tablet (100 mg total) by mouth 2 (two) times daily. 01/26/20   Particia Nearing, PA-C  DULoxetine 40 MG CPEP Take 40 mg by mouth daily. For mood 04/23/18   Aldean Baker, NP  hydrOXYzine (ATARAX/VISTARIL) 25 MG tablet Take 1 tablet (25 mg total) by mouth every 6 (six) hours as needed  for anxiety. 04/22/18   Aldean Baker, NP  meloxicam (MOBIC) 15 MG tablet Take 1 tablet (15 mg total) by mouth daily as needed for pain. 01/26/20   Particia Nearing, PA-C  nicotine (NICODERM CQ - DOSED IN MG/24 HOURS) 21 mg/24hr patch Place 1 patch (21 mg total) onto the skin daily. (May buy over the counter) Patient not taking: Reported on 06/01/2018 04/23/18   Aldean Baker, NP  ondansetron (ZOFRAN) 4 MG tablet Take 1 tablet (4 mg total) by mouth every 8 (eight) hours as needed for nausea or vomiting. 03/17/19   Darr, Gerilyn Pilgrim, PA-C  oseltamivir (TAMIFLU) 75 MG capsule Take 1 capsule (75 mg total) by mouth 2 (two) times daily for 5 days. 07/18/20 07/23/20  Mardella Layman,  MD  pregabalin (LYRICA) 75 MG capsule Take 1 capsule (75 mg total) by mouth 2 (two) times daily. For pain 04/22/18   Aldean Baker, NP  traZODone (DESYREL) 50 MG tablet Take 1 tablet (50 mg total) by mouth at bedtime as needed for sleep. 04/22/18   Aldean Baker, NP  vitamin B-12 (CYANOCOBALAMIN) 1000 MCG tablet Take 1,000 mcg by mouth daily.    [provider]    Allergies    Shellfish allergy and Penicillins  Review of Systems   Review of Systems  Constitutional: Positive for chills.  Respiratory: Positive for cough.   Musculoskeletal: Positive for myalgias. Negative for neck stiffness.  All other systems reviewed and are negative.   Physical Exam Updated Vital Signs BP 116/74   Pulse 87   Temp (!) 100.6 F (38.1 C) (Oral)   Resp 18   SpO2 92%   Physical Exam Vitals and nursing note reviewed.  Constitutional:      General: He is not in acute distress.    Appearance: He is well-developed. He is not diaphoretic.  HENT:     Head: Normocephalic and atraumatic.     Mouth/Throat:     Mouth: Mucous membranes are moist.     Pharynx: No oropharyngeal exudate or posterior oropharyngeal erythema.  Cardiovascular:     Rate and Rhythm: Normal rate and regular rhythm.     Heart sounds: No murmur heard. No friction rub.  Pulmonary:     Effort: Pulmonary effort is normal. No respiratory distress.     Breath sounds: Normal breath sounds. No wheezing or rales.  Abdominal:     General: Bowel sounds are normal. There is no distension.     Palpations: Abdomen is soft.     Tenderness: There is no abdominal tenderness.  Musculoskeletal:        General: Normal range of motion.     Cervical back: Normal range of motion and neck supple. No rigidity or tenderness.  Lymphadenopathy:     Cervical: No cervical adenopathy.  Skin:    General: Skin is warm and dry.  Neurological:     Mental Status: He is alert and oriented to person, place, and time.     Coordination: Coordination  normal.     ED Results / Procedures / Treatments   Labs (all labs ordered are listed, but only abnormal results are displayed) Labs Reviewed  RESP PANEL BY RT-PCR (FLU A&B, COVID) ARPGX2 - Abnormal; Notable for the following components:      Result Value   Influenza A by PCR POSITIVE (*)    All other components within normal limits  CBC WITH DIFFERENTIAL/PLATELET - Abnormal; Notable for the following components:   WBC 13.5 (*)  Neutro Abs 11.2 (*)    Monocytes Absolute 1.2 (*)    All other components within normal limits  BASIC METABOLIC PANEL - Abnormal; Notable for the following components:   Sodium 132 (*)    CO2 20 (*)    Glucose, Bld 105 (*)    Calcium 8.4 (*)    All other components within normal limits    EKG None  Radiology DG Chest 2 View  Result Date: 07/22/2020 CLINICAL DATA:  Cough and congestion. EXAM: CHEST - 2 VIEW COMPARISON:  June 01, 2018 FINDINGS: Mild infiltrate is seen within the left lung base. There is no evidence of a pleural effusion or pneumothorax. The heart size and mediastinal contours are within normal limits. The visualized skeletal structures are unremarkable. IMPRESSION: Mild left basilar infiltrate. Electronically Signed   By: Aram Candela M.D.   On: 07/22/2020 22:13    Procedures Procedures   Medications Ordered in ED Medications  sodium chloride 0.9 % bolus 1,000 mL (has no administration in time range)  ketorolac (TORADOL) 30 MG/ML injection 30 mg (has no administration in time range)    ED Course  I have reviewed the triage vital signs and the nursing notes.  Pertinent labs & imaging results that were available during my care of the patient were reviewed by me and considered in my medical decision making (see chart for details).    MDM Rules/Calculators/A&P  Patient presenting here with complaints of generalized body aches and feeling generally poor all over.  He was diagnosed with influenza A 2 days ago and Tamiflu does  not seem to be helping.  Laboratory studies are unremarkable.  Patient hydrated with normal saline and administered Tylenol and Toradol.  He is now resting more comfortably.  Patient seems appropriate for discharge at this time.  There is no hypoxia and vitals are stable.  Final Clinical Impression(s) / ED Diagnoses Final diagnoses:  None    Rx / DC Orders ED Discharge Orders    None       Geoffery Lyons, MD 07/23/20 [phone]

## 2020-07-23 NOTE — ED Notes (Signed)
Pt c/o generalized bodyaches

## 2020-07-23 NOTE — Discharge Instructions (Addendum)
Drink plenty of fluids and get plenty of rest.  Take ibuprofen 600 mg rotated with Tylenol 1000 mg every 4 hours as needed for pain or fever.  Follow-up with primary doctor if symptoms not improving in the next few days.

## 2022-09-26 IMAGING — DX DG CHEST 2V
2 series · 2 of 2 positions shown · non-contrast
Comparison: June 01, 2018

CLINICAL DATA: Cough and congestion.

EXAM:
CHEST - 2 VIEW

[chest pa]
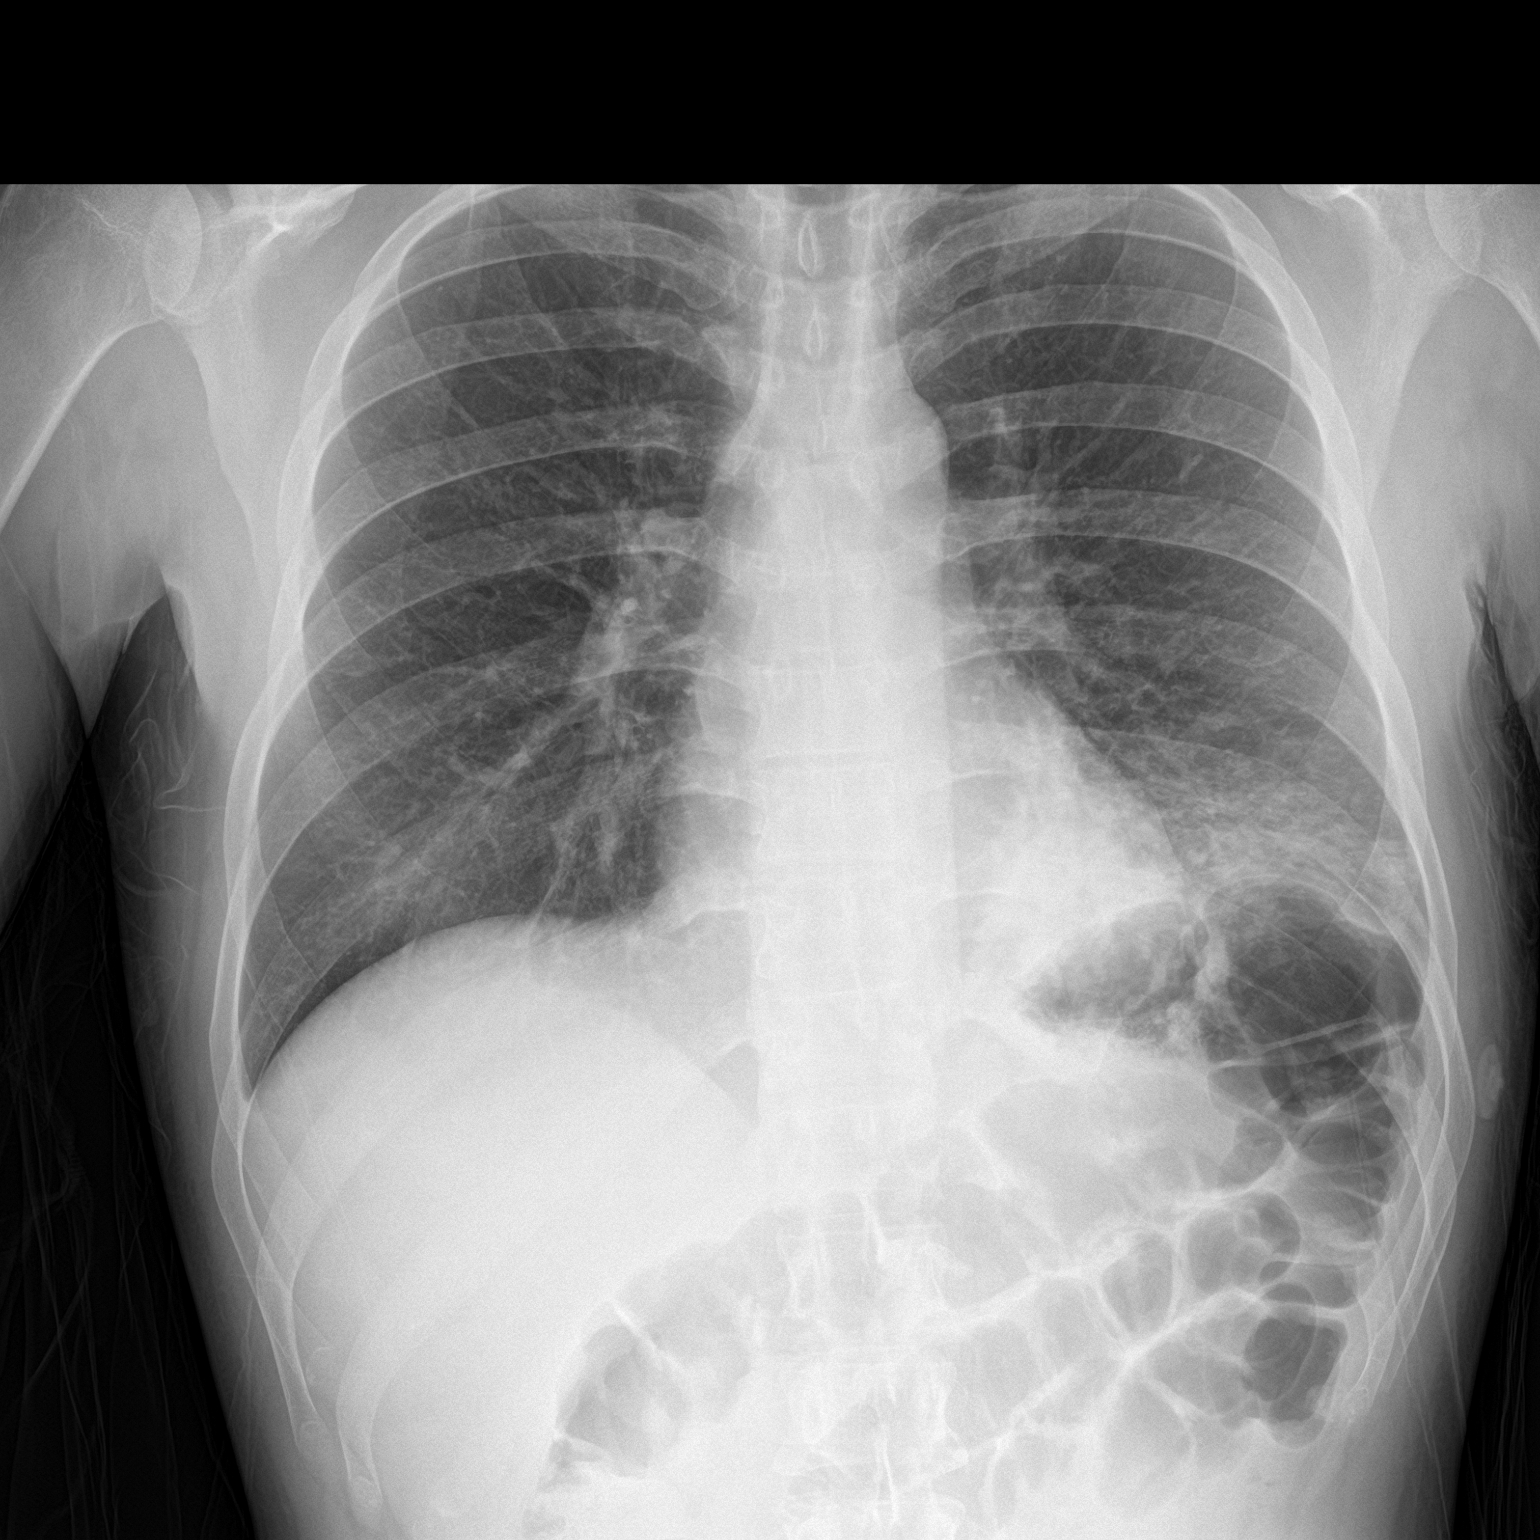

[chest lat]
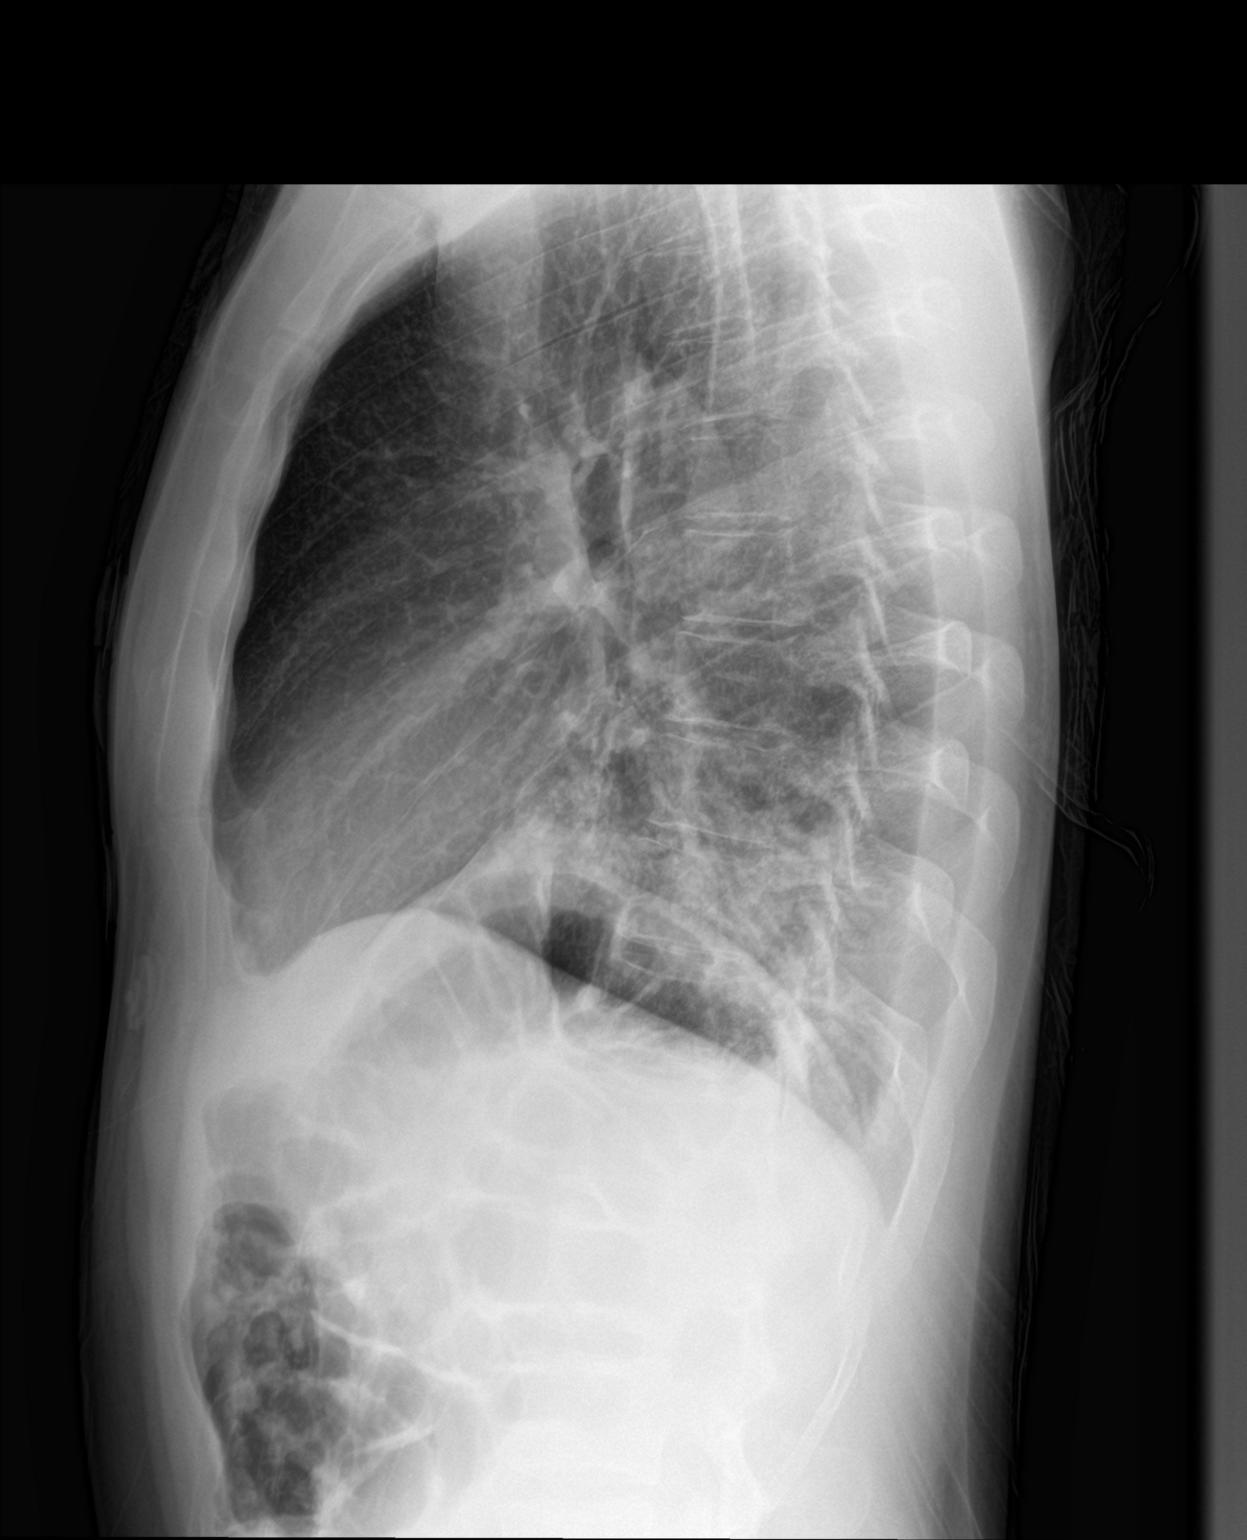

[2 of 2 positions shown; findings below may reference images not displayed]

FINDINGS: Mild infiltrate is seen within the left lung base. There is no
evidence of a pleural effusion or pneumothorax. The heart size and
mediastinal contours are within normal limits. The visualized
skeletal structures are unremarkable.
IMPRESSION: Mild left basilar infiltrate.
